# Patient Record
Sex: Female | Born: 1960 | Race: Asian | Hispanic: No | Marital: Married | Smoking: Never smoker
Health system: Southern US, Community
[De-identification: ages and names within clinical notes are randomized; demographics above are authoritative.]

## PROBLEM LIST (undated history)

## (undated) DIAGNOSIS — E871 Hypo-osmolality and hyponatremia: Secondary | ICD-10-CM

## (undated) DIAGNOSIS — N179 Acute kidney failure, unspecified: Secondary | ICD-10-CM

## (undated) DIAGNOSIS — R739 Hyperglycemia, unspecified: Secondary | ICD-10-CM

## (undated) DIAGNOSIS — N83209 Unspecified ovarian cyst, unspecified side: Secondary | ICD-10-CM

## (undated) HISTORY — PX: ABDOMINAL HYSTERECTOMY: SHX81

---

## 2003-03-02 ENCOUNTER — Encounter: Payer: Self-pay | Admitting: Hematology and Oncology

## 2003-03-02 ENCOUNTER — Encounter: Admission: RE | Admit: 2003-03-02 | Discharge: 2003-03-02 | Payer: Self-pay | Admitting: Hematology and Oncology

## 2003-10-22 ENCOUNTER — Other Ambulatory Visit: Admission: RE | Admit: 2003-10-22 | Discharge: 2003-10-22 | Payer: Self-pay | Admitting: Obstetrics & Gynecology

## 2005-06-02 ENCOUNTER — Other Ambulatory Visit: Admission: RE | Admit: 2005-06-02 | Discharge: 2005-06-02 | Payer: Self-pay | Admitting: Obstetrics and Gynecology

## 2006-06-10 ENCOUNTER — Encounter: Admission: RE | Admit: 2006-06-10 | Discharge: 2006-06-10 | Payer: Self-pay | Admitting: Obstetrics and Gynecology

## 2007-06-23 ENCOUNTER — Encounter: Admission: RE | Admit: 2007-06-23 | Discharge: 2007-06-23 | Payer: Self-pay | Admitting: Obstetrics and Gynecology

## 2008-07-06 ENCOUNTER — Ambulatory Visit (HOSPITAL_COMMUNITY): Admission: RE | Admit: 2008-07-06 | Discharge: 2008-07-06 | Payer: Self-pay | Admitting: Obstetrics and Gynecology

## 2008-07-31 ENCOUNTER — Ambulatory Visit (HOSPITAL_COMMUNITY): Admission: RE | Admit: 2008-07-31 | Discharge: 2008-08-01 | Payer: Self-pay | Admitting: Obstetrics and Gynecology

## 2008-07-31 ENCOUNTER — Encounter (INDEPENDENT_AMBULATORY_CARE_PROVIDER_SITE_OTHER): Payer: Self-pay | Admitting: Obstetrics and Gynecology

## 2008-09-01 ENCOUNTER — Inpatient Hospital Stay (HOSPITAL_COMMUNITY): Admission: AD | Admit: 2008-09-01 | Discharge: 2008-09-01 | Payer: Self-pay | Admitting: Obstetrics and Gynecology

## 2009-06-14 ENCOUNTER — Encounter: Admission: RE | Admit: 2009-06-14 | Discharge: 2009-06-14 | Payer: Self-pay | Admitting: Obstetrics and Gynecology

## 2010-08-19 ENCOUNTER — Encounter
Admission: RE | Admit: 2010-08-19 | Discharge: 2010-08-19 | Payer: Self-pay | Source: Home / Self Care | Attending: Obstetrics and Gynecology | Admitting: Obstetrics and Gynecology

## 2011-01-20 NOTE — H&P (Signed)
NAMEHarlo, [name]                    ACCOUNT NO.:  [id number]   MEDICAL RECORD NO.:  [id number]          PATIENT TYPE:  AMB   LOCATION:  SDC                           FACILITY:  [facility]   PHYSICIAN:  Guy Sandifer. Henderson Cloud, M.D. DATE OF BIRTH:  [birth year]   DATE OF ADMISSION:  DATE OF DISCHARGE:                              HISTORY & PHYSICAL   CHIEF COMPLAINT:  Uterine leiomyomata and ovarian cyst.   HISTORY OF PRESENT ILLNESS:  This patient is a 50 year old married  [place] female, G1, P1 with known uterine leiomyomata.  She has had  increasing cramping and sometimes bad central pelvic pain over the last  several weeks.  Ultrasound on July 05, 2008, she has a uterus  measuring 10.0 x 5.2 x 8.0 cm.  Multiple intramural leiomyomata are  noted ranging from 1.8-4.1 cm.  This represents some growth since May  2009.  The right ovary has a 1.6-cm simple cyst.  The left adnexa  contains a 7.4-cm mass.  Abdominal pelvic CT on July 06, 2008 is  consistent with a 5.6 cm lesion in the left ovary, most consistent with  a dermoid.  There is no evidence of organomegaly, ascites, or adenopathy  in the abdomen or the pelvis.  CA-125 is normal at 11.8.  Enlarging  leiomyomata and probable dermoid cyst of the left ovary is discussed  with the patient.  It should be noted that in the ultrasound in May of  this year, the left ovary appeared normal.  After discussing options of  management, she is being admitted for laparoscopically-assisted vaginal  hysterectomy with bilateral salpingo-oophorectomy.  Potential risks and  complications have been reviewed preoperatively.   PAST MEDICAL HISTORY:  Negative.   PAST SURGICAL HISTORY:  Negative.   FAMILY HISTORY:  Positive for breast cancer in mother, bladder cancer in  father, and chronic hypertension in father.   MEDICATIONS:  Birth control pill.   ALLERGIES:  No known drug allergies.   SOCIAL HISTORY:  Denies tobacco, alcohol, or drug abuse.   REVIEW  OF SYSTEMS:  NEURO:  Denies headache.  CARDIAC:  Denies chest  pain.  PULMONARY:  Denies shortness of breath.  GI:  Denies recent  changes in bowel habits.   PHYSICAL EXAMINATION:  GENERAL:  Height 5 feet 2 inches, weight 115  pounds, and blood pressure 130/70.  HEENT:  Without thyromegaly.  LUNGS:  Clear to auscultation.  HEART:  Regular rate and rhythm.  BACK:  No CVA tenderness.  ABDOMEN:  Soft and nontender without masses.  PELVIC:  Vulva, vagina and cervix without lesion.  Uterus is  retroverted, 8-10 weeks in size.  Adnexal exam compromised.  EXTREMITIES:  Grossly within normal limits.  NEUROLOGICAL:  Grossly within normal limits.   ASSESSMENT:  Enlarging uterine leiomyomata and left adnexal mass.   PLAN:  Laparoscopically-assisted vaginal hysterectomy with bilateral  salpingo-oophorectomy.      Guy Sandifer Henderson Cloud, M.D.  Electronically Signed     JET/MEDQ  D:  07/26/2008  T:  07/27/2008  Job:  [id number]

## 2011-01-20 NOTE — Op Note (Signed)
NAMECourteny, [name]                    ACCOUNT NO.:  [id number]   MEDICAL RECORD NO.:  [id number]          PATIENT TYPE:  OIB   LOCATION:  9316                          [facility]ILITY:  [facility]   PHYSICIAN:  Guy Sandifer. Henderson Cloud, M.D. DATE OF BIRTH:  Jul 15, 1961   DATE OF PROCEDURE:  07/31/2008  DATE OF DISCHARGE:                               OPERATIVE REPORT   PREOPERATIVE DIAGNOSES:  1. Uterine leiomyomata.  2. Left adnexal mass.   POSTOPERATIVE DIAGNOSES:  1. Leiomyomata.  2. Left ovarian cyst.   PROCEDURE:  Laparoscopically-assisted vaginal hysterectomy.   SURGEON:  Guy Sandifer. Henderson Cloud, MD   ASSISTANT:  Stann Mainland. Vincente Poli, MD   ANESTHESIA:  General endotracheal intubation.   SPECIMENS:  Uterus, bilateral tubes and ovaries, peritoneal washings all  to Pathology.   ESTIMATED BLOOD LOSS:  150 mL.   INDICATIONS AND CONSENT:  The patient is a 50 year old married [place]  female G1, P1, with known uterine leiomyomata.  She has increasing  cramping and pain.  She also has approximately 7 cm left adnexal mass.  Details are dictated in the history and physical.  Laparoscopically-  assisted vaginal hysterectomy with bilateral salpingo-oophorectomy has  been discussed with the patient and her husband preoperatively.  Potential risks and complications have been reviewed preoperatively  including, but not limited to infection, organ damage, bleeding  requiring transfusion of blood products with HIV and hepatitis  acquisition, DVT, PE, pneumonia, fistula formation, laparotomy, and  postoperative pelvic pain.  All questions have been answered and consent  was signed on the chart.   FINDINGS:  Upper abdomen is grossly normal.  The Uterus is about 6 weeks  in size with multiple intramural leiomyomata.  Right tube and ovary is  normal.  Left ovary contains approximately 7 cm smooth-walled cyst and  the ovaries totally free.  Peritoneal surfaces are smooth and  glistening.  Anterior posterior cul-de-sacs  were normal.   PROCEDURE IN DETAIL:  The patient was taken to operating room where she  was identified, placed in dorsal supine position, and general anesthesia  was induced via endotracheal intubation.  She was then placed in the  dorsal lithotomy position, where she was prepped abdominally and  vaginally.  Bladder straight catheterized.  Hulka tenaculum was placed  in the uterus as a manipulator, and she was draped in a sterile fashion.  The infraumbilical and suprapubic areas were injected in the midline  with approximately 5 mL of 0.5% plain Marcaine.  Small infraumbilical  incision was made and a disposable Veress needle was placed.  A normal  syringe and drop test were noted.  Two liters of gas were then  insufflated under low pressure with good tympany in the right upper  quadrant.  Veress needle was removed.  A 10/11 Xcel bladeless disposable  trocar sleeve was then placed using direct visualization with the  diagnostic laparoscope.  After placement, the operative laparoscope was  placed.  A small suprapubic incision was made at the midline and a 5-mm  Xcel bladeless disposable trocar sleeve was placed under direct  visualization without difficulty.  The above findings were noted.  Saline was washed through the pelvis and the abdomen.  It was collected  in the posterior cul-de-sac and sent to for cytology.  Course of the  ureters were identified bilaterally and seem to be well clear of the  area of surgery.  Then, using the EnSeal bipolar cautery device, the  right infundibulopelvic ligament was taken down following the  mesosalpinx across the round ligament down to the vesicouterine  peritoneum.  Similar procedure was carried out on the left side.  Vesicouterine peritoneum was taken down cephalad laterally.  Good  hemostasis was noted.  Instruments were removed.  Attention was turned  to the vagina.  Posterior cul-de-sac was entered sharply.  Cervix was  circumscribed with  unipolar cautery.  Mucosa was advanced sharply and  bluntly.  Then, using the LigaSure bipolar handheld device, the  uterosacral ligaments were taken down bilaterally followed by the  bladder pillars, cardinal ligaments, and uterine vessels.  Specimens  delivered posteriorly along with the ovaries, which were removed intact.  There is no spillage or rupture of the cyst.  Then, all suture will be 0  Monocryl unless otherwise designated.  Uterosacral ligaments were  plicated the vaginal cuff bilaterally.  Then, plicated in the midline  with a third suture.  Cuff was closed with figure-of-eight.  Foley  catheter was placed in the bladder and clear urine was noted.  The  laparoscope was then reintroduced with pneumoperitoneum and inspection  reveals minor bleeding at peritoneal edges, which was controlled with  bipolar cautery.  Inspection under reduced pneumoperitoneum reveals good  hemostasis.  Excess fluid was removed.  Plain Marcaine 10 mL of 0.5% was  instilled into the peritoneal cavity.  Trocar sleeves and instruments  were removed.  The skin incisions were closed with interrupted 3-0  Vicryl suture.  Dermabond was placed on both incisions as well.  All  counts were correct.  The patient was awakened and taken to recovery  room in stable condition.      Guy Sandifer Henderson Cloud, M.D.  Electronically Signed     JET/MEDQ  D:  07/31/2008  T:  07/31/2008  Job:  [id number]

## 2011-01-20 NOTE — Discharge Summary (Signed)
NAMEJayleah, [name]                    ACCOUNT NO.:  [id number]   MEDICAL RECORD NO.:  [id number]          PATIENT TYPE:  OIB   LOCATION:  9316                          [facility]ILITY[facility]   PHYSICIAN:  Guy Sandifer. Henderson Cloud, M.D. DATE OF BIRTH:  October 29, 1960   DATE OF ADMISSION:  07/31/2008  DATE OF DISCHARGE:  08/01/2008                               DISCHARGE SUMMARY   PROCEDURE:  Procedure on July 31, 2008. Laparoscopically-assisted  vaginal hysterectomy with bilateral salpingo-oophorectomy.   REASON FOR ADMISSION:  This patient is a 50 year old married female G1,  P1 with symptomatic uterine leiomyomata and an ovarian cyst.  Details  are dictated in history and physical.  She is admitted for surgical  management.   HOSPITAL COURSE:  The patient is admitted to the hospital and undergoes  the above procedure.  Postoperatively she has good pain relief and was  passing flatus.  Vital signs are stable.  She is afebrile with clear  urine output.  On the first postoperative day, she is passing flatus and  tolerating regular diet, ambulating, and voiding.  Vital signs are  stable.  She is afebrile.  Hemoglobin is 11.9 and pathology is pending.  Abdomen is flat and soft with good bowel sounds.   CONDITION ON DISCHARGE:  Good.   DIET:  Regular as tolerated.   ACTIVITY:  No lifting.  No operation of automobiles.  No vaginal entry.  She is to call the office for any problems including, but not limited  to, temperature 101 degrees, persistent nausea and vomiting, heavy  bleeding or increasing pain.   FOLLOWUP:  Follow up with [place] in the office in 2 weeks.   MEDICATIONS:  1. Percocet 5/325 mg # 40 1-2 p.o. q.6 h. p.r.n.  2. Ibuprofen q.6 h p.r.n.  3. Colace daily p.r.n.  4. Phenergan 25 mg # 10 one p.o. q.8 h. p.r.n. nausea and vomiting no      refills.      Guy Sandifer Henderson Cloud, M.D.  Electronically Signed     JET/MEDQ  D:  08/01/2008  T:  08/01/2008  Job:  [id number]

## 2011-06-09 LAB — COMPREHENSIVE METABOLIC PANEL
ALT: 17
Alkaline Phosphatase: 42
CO2: 26
Chloride: 103
GFR calc non Af Amer: >60
Glucose, Bld: 115 — ABNORMAL HIGH
Potassium: 4.3
Sodium: 136

## 2011-06-09 LAB — CBC
Hemoglobin: 11.9 — ABNORMAL LOW
Hemoglobin: 15.4 — ABNORMAL HIGH
MCHC: 34.7
MCV: 97.7
RBC: 3.52 — ABNORMAL LOW
RBC: 4.56
WBC: 11.3 — ABNORMAL HIGH

## 2011-06-09 LAB — HCG, SERUM, QUALITATIVE: Preg, Serum: NEGATIVE

## 2011-06-12 LAB — CBC
HCT: 36.7 % (ref 36.0–46.0)
MCHC: 33.8 g/dL (ref 30.0–36.0)
MCV: 97.3 fL (ref 78.0–100.0)
Platelets: 290 10*3/uL (ref 150–400)
WBC: 8.4 10*3/uL (ref 4.0–10.5)

## 2011-10-30 ENCOUNTER — Other Ambulatory Visit: Payer: Self-pay | Admitting: Obstetrics and Gynecology

## 2011-10-30 DIAGNOSIS — Z1231 Encounter for screening mammogram for malignant neoplasm of breast: Secondary | ICD-10-CM

## 2011-11-12 ENCOUNTER — Ambulatory Visit
Admission: RE | Admit: 2011-11-12 | Discharge: 2011-11-12 | Disposition: A | Discharge status: Home / Self Care | Payer: PRIVATE HEALTH INSURANCE | Source: Ambulatory Visit | Attending: Obstetrics and Gynecology | Admitting: Obstetrics and Gynecology

## 2011-11-12 DIAGNOSIS — Z1231 Encounter for screening mammogram for malignant neoplasm of breast: Secondary | ICD-10-CM

## 2012-07-26 ENCOUNTER — Other Ambulatory Visit: Payer: Self-pay | Admitting: Family Medicine

## 2012-07-26 DIAGNOSIS — M8580 Other specified disorders of bone density and structure, unspecified site: Secondary | ICD-10-CM

## 2012-07-28 ENCOUNTER — Other Ambulatory Visit: Payer: Self-pay | Admitting: Family Medicine

## 2012-07-28 DIAGNOSIS — Z1231 Encounter for screening mammogram for malignant neoplasm of breast: Secondary | ICD-10-CM

## 2012-09-02 ENCOUNTER — Other Ambulatory Visit: Payer: PRIVATE HEALTH INSURANCE

## 2012-09-02 ENCOUNTER — Ambulatory Visit: Payer: PRIVATE HEALTH INSURANCE

## 2012-11-17 ENCOUNTER — Ambulatory Visit
Admission: RE | Admit: 2012-11-17 | Discharge: 2012-11-17 | Disposition: A | Discharge status: Home / Self Care | Payer: PRIVATE HEALTH INSURANCE | Source: Ambulatory Visit | Attending: Family Medicine | Admitting: Family Medicine

## 2012-11-17 DIAGNOSIS — M8580 Other specified disorders of bone density and structure, unspecified site: Secondary | ICD-10-CM

## 2012-11-17 DIAGNOSIS — Z1231 Encounter for screening mammogram for malignant neoplasm of breast: Secondary | ICD-10-CM

## 2013-05-22 ENCOUNTER — Other Ambulatory Visit: Payer: Self-pay | Admitting: Gastroenterology

## 2013-10-13 ENCOUNTER — Other Ambulatory Visit: Payer: Self-pay

## 2013-10-13 DIAGNOSIS — Z1231 Encounter for screening mammogram for malignant neoplasm of breast: Secondary | ICD-10-CM

## 2013-11-23 ENCOUNTER — Ambulatory Visit: Payer: PRIVATE HEALTH INSURANCE

## 2013-12-19 ENCOUNTER — Ambulatory Visit: Payer: PRIVATE HEALTH INSURANCE

## 2014-01-18 ENCOUNTER — Ambulatory Visit: Payer: PRIVATE HEALTH INSURANCE

## 2014-01-25 ENCOUNTER — Encounter (INDEPENDENT_AMBULATORY_CARE_PROVIDER_SITE_OTHER): Payer: Self-pay

## 2014-01-25 ENCOUNTER — Ambulatory Visit
Admission: RE | Admit: 2014-01-25 | Discharge: 2014-01-25 | Disposition: A | Discharge status: Home / Self Care | Payer: PRIVATE HEALTH INSURANCE | Source: Ambulatory Visit

## 2014-01-25 DIAGNOSIS — Z1231 Encounter for screening mammogram for malignant neoplasm of breast: Secondary | ICD-10-CM

## 2015-03-08 ENCOUNTER — Other Ambulatory Visit: Payer: Self-pay

## 2015-03-08 DIAGNOSIS — Z1231 Encounter for screening mammogram for malignant neoplasm of breast: Secondary | ICD-10-CM

## 2015-04-08 ENCOUNTER — Ambulatory Visit
Admission: RE | Admit: 2015-04-08 | Discharge: 2015-04-08 | Disposition: A | Discharge status: Home / Self Care | Payer: Commercial Managed Care - PPO | Source: Ambulatory Visit

## 2015-04-08 DIAGNOSIS — Z1231 Encounter for screening mammogram for malignant neoplasm of breast: Secondary | ICD-10-CM

## 2015-06-06 ENCOUNTER — Encounter (INDEPENDENT_AMBULATORY_CARE_PROVIDER_SITE_OTHER): Payer: Commercial Managed Care - PPO | Admitting: Ophthalmology

## 2015-06-06 DIAGNOSIS — H43813 Vitreous degeneration, bilateral: Secondary | ICD-10-CM

## 2015-06-06 DIAGNOSIS — H4423 Degenerative myopia, bilateral: Secondary | ICD-10-CM | POA: Diagnosis not present

## 2015-12-04 ENCOUNTER — Encounter (INDEPENDENT_AMBULATORY_CARE_PROVIDER_SITE_OTHER): Payer: Commercial Managed Care - PPO | Admitting: Ophthalmology

## 2016-01-01 ENCOUNTER — Encounter (INDEPENDENT_AMBULATORY_CARE_PROVIDER_SITE_OTHER): Payer: Commercial Managed Care - PPO | Admitting: Ophthalmology

## 2016-01-01 DIAGNOSIS — H2513 Age-related nuclear cataract, bilateral: Secondary | ICD-10-CM | POA: Diagnosis not present

## 2016-01-01 DIAGNOSIS — H4423 Degenerative myopia, bilateral: Secondary | ICD-10-CM

## 2016-01-01 DIAGNOSIS — H43813 Vitreous degeneration, bilateral: Secondary | ICD-10-CM | POA: Diagnosis not present

## 2016-02-25 ENCOUNTER — Encounter (HOSPITAL_BASED_OUTPATIENT_CLINIC_OR_DEPARTMENT_OTHER): Payer: Self-pay | Admitting: *Deleted

## 2016-02-25 ENCOUNTER — Observation Stay (HOSPITAL_BASED_OUTPATIENT_CLINIC_OR_DEPARTMENT_OTHER)
Admission: EM | Admit: 2016-02-25 | Discharge: 2016-02-27 | Disposition: A | Discharge status: Home / Self Care | Payer: Commercial Managed Care - PPO | Attending: Internal Medicine | Admitting: Internal Medicine

## 2016-02-25 DIAGNOSIS — E878 Other disorders of electrolyte and fluid balance, not elsewhere classified: Secondary | ICD-10-CM | POA: Diagnosis not present

## 2016-02-25 DIAGNOSIS — R251 Tremor, unspecified: Secondary | ICD-10-CM | POA: Insufficient documentation

## 2016-02-25 DIAGNOSIS — N179 Acute kidney failure, unspecified: Secondary | ICD-10-CM | POA: Diagnosis not present

## 2016-02-25 DIAGNOSIS — R42 Dizziness and giddiness: Secondary | ICD-10-CM | POA: Diagnosis not present

## 2016-02-25 DIAGNOSIS — R739 Hyperglycemia, unspecified: Secondary | ICD-10-CM | POA: Diagnosis not present

## 2016-02-25 DIAGNOSIS — R262 Difficulty in walking, not elsewhere classified: Secondary | ICD-10-CM | POA: Insufficient documentation

## 2016-02-25 DIAGNOSIS — E871 Hypo-osmolality and hyponatremia: Principal | ICD-10-CM | POA: Diagnosis present

## 2016-02-25 HISTORY — DX: Unspecified ovarian cyst, unspecified side: N83.209

## 2016-02-25 HISTORY — DX: Acute kidney failure, unspecified: N17.9

## 2016-02-25 HISTORY — DX: Hypo-osmolality and hyponatremia: E87.1

## 2016-02-25 HISTORY — DX: Hyperglycemia, unspecified: R73.9

## 2016-02-25 LAB — BASIC METABOLIC PANEL
ANION GAP: 7 (ref 5–15)
ANION GAP: 9 (ref 5–15)
ANION GAP: 7 (ref 5–15)
BUN: 6 mg/dL (ref 6–20)
BUN: <5 mg/dL — ABNORMAL LOW (ref 6–20)
BUN: <5 mg/dL — ABNORMAL LOW (ref 6–20)
CALCIUM: 8 mg/dL — AB (ref 8.9–10.3)
CHLORIDE: 86 mmol/L — AB (ref 101–111)
CHLORIDE: 87 mmol/L — AB (ref 101–111)
CHLORIDE: 88 mmol/L — AB (ref 101–111)
CO2: 20 mmol/L — AB (ref 22–32)
CO2: 19 mmol/L — AB (ref 22–32)
CO2: 20 mmol/L — AB (ref 22–32)
Calcium: 7.5 mg/dL — ABNORMAL LOW (ref 8.9–10.3)
Calcium: 7.8 mg/dL — ABNORMAL LOW (ref 8.9–10.3)
Creatinine, Ser: 0.41 mg/dL — ABNORMAL LOW (ref 0.44–1.00)
Creatinine, Ser: 0.48 mg/dL (ref 0.44–1.00)
Creatinine, Ser: 0.4 mg/dL — ABNORMAL LOW (ref 0.44–1.00)
GFR calc Af Amer: >60 mL/min (ref >60)
GFR calc non Af Amer: >60 mL/min (ref >60)
GFR calc non Af Amer: >60 mL/min (ref >60)
GLUCOSE: 127 mg/dL — AB (ref 65–99)
GLUCOSE: 116 mg/dL — AB (ref 65–99)
GLUCOSE: 118 mg/dL — AB (ref 65–99)
POTASSIUM: 3.8 mmol/L (ref 3.5–5.1)
Potassium: 3.7 mmol/L (ref 3.5–5.1)
Potassium: 4 mmol/L (ref 3.5–5.1)
Sodium: 113 mmol/L — CL (ref 135–145)
Sodium: 115 mmol/L — CL (ref 135–145)
Sodium: 115 mmol/L — CL (ref 135–145)

## 2016-02-25 LAB — URINE MICROSCOPIC-ADD ON

## 2016-02-25 LAB — COMPREHENSIVE METABOLIC PANEL
ALK PHOS: 60 U/L (ref 38–126)
ALT: 18 U/L (ref 14–54)
AST: 31 U/L (ref 15–41)
Albumin: 3.9 g/dL (ref 3.5–5.0)
Anion gap: 10 (ref 5–15)
BUN: 7 mg/dL (ref 6–20)
CALCIUM: 8.4 mg/dL — AB (ref 8.9–10.3)
CO2: 20 mmol/L — ABNORMAL LOW (ref 22–32)
CREATININE: 0.5 mg/dL (ref 0.44–1.00)
Chloride: 82 mmol/L — ABNORMAL LOW (ref 101–111)
Glucose, Bld: 145 mg/dL — ABNORMAL HIGH (ref 65–99)
Potassium: 3.7 mmol/L (ref 3.5–5.1)
Sodium: 112 mmol/L — CL (ref 135–145)
TOTAL PROTEIN: 7 g/dL (ref 6.5–8.1)
Total Bilirubin: 1.2 mg/dL (ref 0.3–1.2)

## 2016-02-25 LAB — CBC WITH DIFFERENTIAL/PLATELET
BASOS PCT: 0 %
Basophils Absolute: 0 10*3/uL (ref 0.0–0.1)
EOS ABS: 0 10*3/uL (ref 0.0–0.7)
EOS PCT: 0 %
HCT: 35 % — ABNORMAL LOW (ref 36.0–46.0)
HEMOGLOBIN: 12.7 g/dL (ref 12.0–15.0)
Lymphocytes Relative: 10 %
Lymphs Abs: 0.8 10*3/uL (ref 0.7–4.0)
MCH: 31.7 pg (ref 26.0–34.0)
MCHC: 36.3 g/dL — AB (ref 30.0–36.0)
MCV: 87.3 fL (ref 78.0–100.0)
Monocytes Absolute: 0.4 10*3/uL (ref 0.1–1.0)
Monocytes Relative: 4 %
NEUTROS PCT: 85 %
Neutro Abs: 6.9 10*3/uL (ref 1.7–7.7)
PLATELETS: 255 10*3/uL (ref 150–400)
RBC: 4.01 MIL/uL (ref 3.87–5.11)
RDW: 10.9 % — ABNORMAL LOW (ref 11.5–15.5)
WBC: 8.1 10*3/uL (ref 4.0–10.5)

## 2016-02-25 LAB — URINALYSIS, ROUTINE W REFLEX MICROSCOPIC
Bilirubin Urine: NEGATIVE
Glucose, UA: >1000 mg/dL — AB
KETONES UR: 40 mg/dL — AB
LEUKOCYTES UA: NEGATIVE
NITRITE: NEGATIVE
PH: 7 (ref 5.0–8.0)
PROTEIN: 30 mg/dL — AB
Specific Gravity, Urine: 1.022 (ref 1.005–1.030)

## 2016-02-25 LAB — GLUCOSE, CAPILLARY: Glucose-Capillary: 136 mg/dL — ABNORMAL HIGH (ref 65–99)

## 2016-02-25 LAB — OSMOLALITY: OSMOLALITY: 239 mosm/kg — AB (ref 275–295)

## 2016-02-25 LAB — SODIUM, URINE, RANDOM: Sodium, Ur: 98 mmol/L

## 2016-02-25 LAB — OSMOLALITY, URINE: Osmolality, Ur: 498 mosm/kg (ref 300–900)

## 2016-02-25 LAB — PREGNANCY, URINE: Preg Test, Ur: NEGATIVE

## 2016-02-25 LAB — MAGNESIUM: MAGNESIUM: 1.5 mg/dL — AB (ref 1.7–2.4)

## 2016-02-25 MED ORDER — ONDANSETRON HCL 4 MG/2ML IJ SOLN: 4.0000 mg | Freq: Four times a day (QID) | INTRAMUSCULAR | Status: DC | PRN

## 2016-02-25 MED ORDER — SODIUM CHLORIDE 0.9 % IV BOLUS (SEPSIS)
1000.0000 mL | Freq: Once | INTRAVENOUS | Status: AC
  Administered 2016-02-25: 1000 mL via INTRAVENOUS

## 2016-02-25 MED ORDER — SODIUM CHLORIDE 0.9 % IV SOLN
INTRAVENOUS | Status: DC
  Administered 2016-02-25: 75 mL via INTRAVENOUS

## 2016-02-25 MED ORDER — LORAZEPAM 2 MG/ML IJ SOLN: 1.0000 mg | Freq: Once | INTRAMUSCULAR | Status: DC | PRN

## 2016-02-25 MED ORDER — ONDANSETRON HCL 4 MG PO TABS: 4.0000 mg | ORAL_TABLET | Freq: Four times a day (QID) | ORAL | Status: DC | PRN

## 2016-02-25 MED ORDER — LORAZEPAM 2 MG/ML IJ SOLN
0.5000 mg | Freq: Once | INTRAMUSCULAR | Status: AC
  Administered 2016-02-25: 0.5 mg via INTRAVENOUS
  Filled 2016-02-25: qty 1

## 2016-02-25 MED ORDER — TRAZODONE HCL 50 MG PO TABS
50.0000 mg | ORAL_TABLET | Freq: Every evening | ORAL | Status: DC | PRN
  Filled 2016-02-25 (×2): qty 1

## 2016-02-25 MED ORDER — HYDROCODONE-ACETAMINOPHEN 5-325 MG PO TABS: 1.0000 | ORAL_TABLET | ORAL | Status: DC | PRN

## 2016-02-25 MED ORDER — POLYETHYLENE GLYCOL 3350 17 G PO PACK: 17.0000 g | PACK | Freq: Every day | ORAL | Status: DC | PRN

## 2016-02-25 MED ORDER — ACETAMINOPHEN 650 MG RE SUPP: 650.0000 mg | Freq: Four times a day (QID) | RECTAL | Status: DC | PRN

## 2016-02-25 MED ORDER — MAGNESIUM SULFATE 2 GM/50ML IV SOLN
2.0000 g | Freq: Once | INTRAVENOUS | Status: AC
  Administered 2016-02-25: 2 g via INTRAVENOUS
  Filled 2016-02-25: qty 50

## 2016-02-25 MED ORDER — ENOXAPARIN SODIUM 40 MG/0.4ML ~~LOC~~ SOLN
40.0000 mg | SUBCUTANEOUS | Status: DC
  Administered 2016-02-25 – 2016-02-26 (×2): 40 mg via SUBCUTANEOUS
  Filled 2016-02-25 (×2): qty 0.4

## 2016-02-25 MED ORDER — ACETAMINOPHEN 325 MG PO TABS: 650.0000 mg | ORAL_TABLET | Freq: Four times a day (QID) | ORAL | Status: DC | PRN

## 2016-02-25 NOTE — Progress Notes (Signed)
Patient coming from [facility] due to profound symptomatic hyponatremia. Sodium 112. Patient to be continued on normal saline. Patient will be placed in telemetry. Previous known sodium was 136 back in 2009. This is felt to be due to a medication reaction as patient was recently started on Bactrim for UTI. Patient's symptoms include resting tremor and vertigo. Otherwise workup benign.   Shelly Flattenavid Merrell, MD [facility] 02/25/2016, 1:22 PM

## 2016-02-25 NOTE — ED Notes (Signed)
Notified [facility] of request for transportation to [facility] [phone] awaiting truck

## 2016-02-25 NOTE — Progress Notes (Signed)
CRITICAL VALUE ALERT  Critical value received: NA 115  Date of notification:02-25-16   Time of notification:  1745  Critical value read back:Yes.    Nurse who received alert:  Sophronia SimasMiranda Custer Pimenta RN   MD notified (1st page):  Dr Konrad DoloresMerrell   Time of first page:  1750  MD notified (2nd page):  Time of second page:  Responding MD:  Dr Konrad DoloresMerrell  Time MD responded:  1800

## 2016-02-25 NOTE — ED Notes (Signed)
Pt here with her husband.  He feels that she is having an allergic reaction to Septra DS.  Pt is having difficulty ambulating, has a tremor and reports dizziness.  Pt was put on Macrobid for UTI and when urine culture returned she was switched to Septra DS on 6/16.  She began feeling dizziness and tremor and difficulty ambulating on Sunday.  Last dose of Septra was taken yesterday am.  Husband states that she is alert and oriented to slower to respond ("few second delay").  No focal neuro deficit, symptoms are generalized.  Dizziness when laying down with eyes closed, improves when looking around and sitting up (couldnt sleep due to dizziness)

## 2016-02-25 NOTE — ED Notes (Signed)
Hospitalist has been repaged

## 2016-02-25 NOTE — H&P (Signed)
History and Physical    [name] ZOX:[id number] DOB: 08/18/1961 DOA: 02/25/2016  PCP: No primary care provider on file. Patient coming from: [facility]  Chief Complaint: Dizziness and shaking  HPI: [name] is a 55 y.o. female with medical history significant of hysterectomy secondary to ovarian cysts. Patient presenting with one half day complaint of tremor, dizziness and nausea. Patient states that approximately 5 days ago she started on Macrobid for treatment of UTI. She was called 3 days ago and told that she needed a different antibiotic in order to properly treat her UTI. She was subsequently started on Bactrim. Sunday night patient began developing slight tremor and dizziness. This is become constant and progressive. Patient states that this is sometimes like the room is spinning. Denies any vomiting, chest pain, short of breath, palpitations, headache, neck stiffness, fevers, diarrhea. Patient states that her dysuria and frequency have completely resolved. Denies any focal weakness in upper or lower extremities or truncal instability. No previous symptoms like this.    ED Course: Basic labs drawn the patient found be profoundly hyponatremic at 112. Patient also found to be hyperglycemic. No imaging studies done. Patient given 1 L normal saline bolus and [facility]  Review of Systems: As per HPI otherwise 10 point review of systems negative.   Ambulatory Status:No restrictions  Past Medical History  Diagnosis Date  . Hyponatremia   . Hyperglycemia   . Acute kidney injury (HCC)   . Ovarian cyst     Past Surgical History  Procedure Laterality Date  . Abdominal hysterectomy      Social History   Social History  . Marital Status: Married    Spouse Name: N/A  . Number of Children: N/A  . Years of Education: N/A   Occupational History  . Not on file.   Social History Main Topics  . Smoking status: Never Smoker   . Smokeless tobacco: Not  on file  . Alcohol Use: No  . Drug Use: Not on file  . Sexual Activity: Not on file   Other Topics Concern  . Not on file   Social History Narrative  . No narrative on file    Allergies  Allergen Reactions  . Septra [Sulfamethoxazole-Trimethoprim]     Dizziness, generalized weakness and tremor    Family History  Problem Relation Age of Onset  . Hypertension    . Diabetes    . Cancer      Prior to Admission medications   Medication Sig Start Date End Date Taking? Authorizing Provider  ondansetron (ZOFRAN) 4 MG tablet Take 4 mg by mouth every 8 (eight) hours as needed for nausea or vomiting.   Yes Historical Provider, MD    Physical Exam: Filed Vitals:   02/25/16 1334 02/25/16 1350 02/25/16 1400 02/25/16 1642  BP:  109/69 122/58 180/62  Pulse:  85 87 88  Temp:    98 F (36.7 C)  TempSrc:    Oral  Resp: 17 21 17 18   Height:      Weight:    52.617 kg (116 lb)  SpO2:  97% 99% 98%     General:  Appears calm and comfortable Eyes:  PERRL, EOMI, normal lids, iris ENT:  grossly normal hearing, lips & tongue, mmm Neck:  no LAD, masses or thyromegaly Cardiovascular:  RRR, no m/r/g. No LE edema.  Respiratory:  CTA bilaterally, no w/r/r. Normal respiratory effort. Abdomen:  soft, ntnd, NABS Skin:  no rash  or induration seen on limited exam Musculoskeletal:  grossly normal tone BUE/BLE, good ROM, no bony abnormality Psychiatric:  grossly normal mood and affect, speech fluent and appropriate, AOx3 Neurologic: Cranial nerves II through XII intact, moves all extremity coronary fashion, strength 5 out of 5 bilateral, lower extremity flexion 5 out of 5 bilateral, resting tremor, no asterixis. Patient endorses dizziness which is worsened with head movement.  Labs on Admission: I have personally reviewed following labs and imaging studies  CBC:  Recent Labs Lab 02/25/16 0959  WBC 8.1  NEUTROABS 6.9  HGB 12.7  HCT 35.0*  MCV 87.3  PLT 255   Basic Metabolic  Panel:  Recent Labs Lab 02/25/16 0959 02/25/16 1330  NA 112* 113*  K 3.7 3.8  CL 82* 86*  CO2 20* 20*  GLUCOSE 145* 127*  BUN 7 6  CREATININE 0.50 0.41*  CALCIUM 8.4* 7.5*  MG 1.5*  --    GFR: Estimated Creatinine Clearance: 63.6 mL/min (by C-G formula based on Cr of 0.41). Liver Function Tests:  Recent Labs Lab 02/25/16 0959  AST 31  ALT 18  ALKPHOS 60  BILITOT 1.2  PROT 7.0  ALBUMIN 3.9   No results for input(s): LIPASE, AMYLASE in the last 168 hours. No results for input(s): AMMONIA in the last 168 hours. Coagulation Profile: No results for input(s): INR, PROTIME in the last 168 hours. Cardiac Enzymes: No results for input(s): CKTOTAL, CKMB, CKMBINDEX, TROPONINI in the last 168 hours. BNP (last 3 results) No results for input(s): PROBNP in the last 8760 hours. HbA1C: No results for input(s): HGBA1C in the last 72 hours. CBG: No results for input(s): GLUCAP in the last 168 hours. Lipid Profile: No results for input(s): CHOL, HDL, LDLCALC, TRIG, CHOLHDL, LDLDIRECT in the last 72 hours. Thyroid Function Tests: No results for input(s): TSH, T4TOTAL, FREET4, T3FREE, THYROIDAB in the last 72 hours. Anemia Panel: No results for input(s): VITAMINB12, FOLATE, FERRITIN, TIBC, IRON, RETICCTPCT in the last 72 hours. Urine analysis:    Component Value Date/Time   COLORURINE YELLOW 02/25/2016 1100   APPEARANCEUR CLEAR 02/25/2016 1100   LABSPEC 1.022 02/25/2016 1100   PHURINE 7.0 02/25/2016 1100   GLUCOSEU >1000* 02/25/2016 1100   HGBUR MODERATE* 02/25/2016 1100   BILIRUBINUR NEGATIVE 02/25/2016 1100   KETONESUR 40* 02/25/2016 1100   PROTEINUR 30* 02/25/2016 1100   NITRITE NEGATIVE 02/25/2016 1100   LEUKOCYTESUR NEGATIVE 02/25/2016 1100    Creatinine Clearance: Estimated Creatinine Clearance: 63.6 mL/min (by C-G formula based on Cr of 0.41).  Sepsis Labs: (procalcitonin:4,lacticidven:4) )No results found for this or any previous visit (from the past  240 hour(s)).   Radiological Exams on Admission: No results found.    Assessment/Plan Principal Problem:   Hyponatremia Active Problems:   Acute kidney injury (HCC)   Hyperglycemia   Hypomagnesemia   Hyponatremia: Na 112 on presentation to [facility]. This is never been a problem for the patient before. Suspect this is likely due to a reaction to treatment with Bactrim recently. No other inciting etiology. This will be treated as a chronic change and correction will be at a goal of 9 mmol per liter per day. - NS 125/hr - BMET Q6 - Stop Bactrim - f/u Osmolality studies  Hyperglycemia: 147 serum w/ >1000 in urine. No h/o DM.  - A1c - CBG Q4  Dizziness/Ha/tremor: likely due to multiple metabolic derangements includine profound hyponatremia, hypochloremia, hyperglycemia, hypo-magnesia. Mag given at [facility] - treatment as above. - BMET in am - As sx  are constant, regardless movement will obtain MRI to r/o intracranial process  UTI: UA q/o evidence of UTI. Recently completed 3 days of bactrim - UCX - DC Bactrim  DVT prophylaxis: Lovenox  Code Status: FULL  Family Communication: Husband  Disposition Plan: Pending improvement  Consults called: None  Admission status: Inpatient    Yariel Ferraris J MD [facility]  If 7PM-7AM, please contact night-coverage [email or web address] Password [email or web address]  02/25/2016, 5:15 PM

## 2016-02-25 NOTE — ED Provider Notes (Signed)
CSN: [id number]     Arrival date & time 02/25/16  0911 History   First MD Initiated Contact with Patient 02/25/16 0914     Chief Complaint  Patient presents with  . Medication Reaction     (Consider location/radiation/quality/duration/timing/severity/associated sxs/prior Treatment) HPI 55 year old female who presents with dizziness and concern for medication reaction. She has no significant past medical history. States that recently she developed a urinary tract infection, and her antibiotics was changed from Macrobid to Bactrim 3 days ago. She started taking her Bactrim Saturday, and noticed that Sunday evening she was developing dizziness. States that she feels that she is sea sick, like the room is spinning and moving in front of her. She has nausea but no vomiting. Associated with gait instability. No double vision, difficulty swallowing, slurred speech, focal numbness or weakness, word finding difficulty. States that she has a 3 second delay in answer questions at times.   History reviewed. No pertinent past medical history. Past Surgical History  Procedure Laterality Date  . Abdominal hysterectomy     History reviewed. No pertinent family history. Social History  Substance Use Topics  . Smoking status: Never Smoker   . Smokeless tobacco: None  . Alcohol Use: No   OB History    No data available     Review of Systems 10/14 systems reviewed and are negative other than those stated in the HPI    Allergies  Septra  Home Medications   Prior to Admission medications   Medication Sig Start Date End Date Taking? Authorizing Provider  ondansetron (ZOFRAN) 4 MG tablet Take 4 mg by mouth every 8 (eight) hours as needed for nausea or vomiting.   Yes Historical Provider, MD   BP 106/93 mmHg  Pulse 77  Temp(Src) 99.4 F (37.4 C) (Oral)  Resp 19  Ht  (1.575 m)  Wt 115 lb (52.164 kg)  BMI 21.03 kg/m2  SpO2 98% Physical Exam Physical Exam  Nursing note and vitals  reviewed. Constitutional: Well developed, well nourished, non-toxic, and in no acute distress Head: Normocephalic and atraumatic.  Mouth/Throat: Oropharynx is clear and moist.  Neck: Normal range of motion. Neck supple.  Cardiovascular: Normal rate and regular rhythm.   Pulmonary/Chest: Effort normal and breath sounds normal.  Abdominal: Soft. There is no tenderness. There is no rebound and no guarding.  Musculoskeletal: Normal range of motion.  Neurological: Alert, no facial droop, fluent speech, moves all extremities symmetrically, no pronator drift or LE drift against gravity. Sensation to light touch in tact in all 4 extremities, PERRL, EOMI, resting and intention tremor bilaterally in upper and lower extremities. Skin: Skin is warm and dry.  Psychiatric: Cooperative  ED Course  Procedures (including critical care time) Labs Review Labs Reviewed  URINALYSIS, ROUTINE W REFLEX MICROSCOPIC (NOT AT [facility]) - Abnormal; Notable for the following:    Glucose, UA >1000 (*)    Hgb urine dipstick MODERATE (*)    Ketones, ur 40 (*)    Protein, ur 30 (*)    All other components within normal limits  CBC WITH DIFFERENTIAL/PLATELET - Abnormal; Notable for the following:    HCT 35.0 (*)    MCHC 36.3 (*)    RDW 10.9 (*)    All other components within normal limits  COMPREHENSIVE METABOLIC PANEL - Abnormal; Notable for the following:    Sodium 112 (*)    Chloride 82 (*)    CO2 20 (*)    Glucose, Bld 145 (*)  Calcium 8.4 (*)    All other components within normal limits  MAGNESIUM - Abnormal; Notable for the following:    Magnesium 1.5 (*)    All other components within normal limits  URINE MICROSCOPIC-ADD ON - Abnormal; Notable for the following:    Squamous Epithelial / LPF 0-5 (*)    Bacteria, UA FEW (*)    All other components within normal limits  PREGNANCY, URINE    Imaging Review No results found. I have personally reviewed and evaluated these images and lab results as part of  my medical decision-making.   EKG Interpretation None     CRITICAL CARE Performed by: Lavera Guiseana Duo Jarvin Ogren   Total critical care time: 30 minutes  Critical care time was exclusive of separately billable procedures and treating other patients.  Critical care was necessary to treat or prevent imminent or life-threatening deterioration.  Critical care was time spent personally by me on the following activities: development of treatment plan with patient and/or surrogate as well as nursing, discussions with consultants, evaluation of patient's response to treatment, examination of patient, obtaining history from patient or surrogate, ordering and performing treatments and interventions, ordering and review of laboratory studies, ordering and review of radiographic studies, pulse oximetry and re-evaluation of patient's condition.  MDM   Final diagnoses:  Hyponatremia    55 year old female who presents with dizziness and tremoring after taking Bactrim 3 days ago. On presentation is nontoxic and in no acute distress, but with active tremoring in bilateral arms and legs. Otherwise is grossly neurologically intact. Symptoms sound vertiginous in nature. Blood work with severe hyponatremia of 112. No history of seizures, and still mentating normally. Had received a liter of IV normal saline prior to blood work resulting. Had also received 0.5 mg of Ativan for tremors and vertigo, with subjective improvement.  It does appear that Bactrim can have side effects of vertigo, ataxia, and hyponatremia. She has discontinued this medication yesterday, last dose yesterday morning. Will admit for ongoing management.  PCP: Dr. Tenny Crawoss from [facility]  Diane Hanel Gearldine Bienenstockuo Breland Trouten, MD 02/25/16 1146

## 2016-02-25 NOTE — ED Notes (Signed)
Report given to RN on floor 6 e

## 2016-02-25 NOTE — Progress Notes (Signed)
CRITICAL VALUE ALERT  Critical value received:  Serum osmolality 239  Date of notification:  02-25-16   Time of notification:  1847  Critical value read back:Yes.    Nurse who received alert:  Sophronia SimasMiranda Aarya Robinson RN  MD notified (1st page):  Dr Konrad DoloresMerrell   Time of first page:  1851    MD notified (2nd page):  Time of second page:  Responding MD:    Time MD responded:

## 2016-02-26 ENCOUNTER — Inpatient Hospital Stay (HOSPITAL_COMMUNITY): Payer: Commercial Managed Care - PPO

## 2016-02-26 DIAGNOSIS — E871 Hypo-osmolality and hyponatremia: Secondary | ICD-10-CM

## 2016-02-26 DIAGNOSIS — R251 Tremor, unspecified: Secondary | ICD-10-CM | POA: Diagnosis not present

## 2016-02-26 DIAGNOSIS — R42 Dizziness and giddiness: Secondary | ICD-10-CM | POA: Diagnosis not present

## 2016-02-26 LAB — BASIC METABOLIC PANEL
ANION GAP: 4 — AB (ref 5–15)
Anion gap: 6 (ref 5–15)
Anion gap: 6 (ref 5–15)
Anion gap: 6 (ref 5–15)
Anion gap: 5 (ref 5–15)
Anion gap: 5 (ref 5–15)
BUN: 8 mg/dL (ref 6–20)
BUN: 9 mg/dL (ref 6–20)
BUN: 9 mg/dL (ref 6–20)
BUN: 9 mg/dL (ref 6–20)
BUN: 9 mg/dL (ref 6–20)
CALCIUM: 8.4 mg/dL — AB (ref 8.9–10.3)
CALCIUM: 8.1 mg/dL — AB (ref 8.9–10.3)
CALCIUM: 8.3 mg/dL — AB (ref 8.9–10.3)
CALCIUM: 7.8 mg/dL — AB (ref 8.9–10.3)
CHLORIDE: 91 mmol/L — AB (ref 101–111)
CHLORIDE: 103 mmol/L (ref 101–111)
CO2: 22 mmol/L (ref 22–32)
CO2: 22 mmol/L (ref 22–32)
CO2: 24 mmol/L (ref 22–32)
CO2: 23 mmol/L (ref 22–32)
CO2: 24 mmol/L (ref 22–32)
CO2: 24 mmol/L (ref 22–32)
CREATININE: 0.73 mg/dL (ref 0.44–1.00)
CREATININE: 0.63 mg/dL (ref 0.44–1.00)
CREATININE: 0.57 mg/dL (ref 0.44–1.00)
Calcium: 7.8 mg/dL — ABNORMAL LOW (ref 8.9–10.3)
Calcium: 8.3 mg/dL — ABNORMAL LOW (ref 8.9–10.3)
Chloride: 102 mmol/L (ref 101–111)
Chloride: 102 mmol/L (ref 101–111)
Chloride: 102 mmol/L (ref 101–111)
Chloride: 106 mmol/L (ref 101–111)
Creatinine, Ser: 0.5 mg/dL (ref 0.44–1.00)
Creatinine, Ser: 0.58 mg/dL (ref 0.44–1.00)
Creatinine, Ser: 0.57 mg/dL (ref 0.44–1.00)
GFR calc Af Amer: >60 mL/min (ref >60)
GFR calc Af Amer: >60 mL/min (ref >60)
GFR calc Af Amer: >60 mL/min (ref >60)
GFR calc Af Amer: >60 mL/min (ref >60)
GFR calc Af Amer: >60 mL/min (ref >60)
GFR calc non Af Amer: >60 mL/min (ref >60)
GFR calc non Af Amer: >60 mL/min (ref >60)
GFR calc non Af Amer: >60 mL/min (ref >60)
GLUCOSE: 106 mg/dL — AB (ref 65–99)
GLUCOSE: 100 mg/dL — AB (ref 65–99)
GLUCOSE: 134 mg/dL — AB (ref 65–99)
GLUCOSE: 104 mg/dL — AB (ref 65–99)
GLUCOSE: 102 mg/dL — AB (ref 65–99)
Glucose, Bld: 84 mg/dL (ref 65–99)
POTASSIUM: 3.6 mmol/L (ref 3.5–5.1)
Potassium: 3.5 mmol/L (ref 3.5–5.1)
Potassium: 3.8 mmol/L (ref 3.5–5.1)
Potassium: 3.7 mmol/L (ref 3.5–5.1)
Potassium: 3.2 mmol/L — ABNORMAL LOW (ref 3.5–5.1)
Potassium: 3.9 mmol/L (ref 3.5–5.1)
SODIUM: 130 mmol/L — AB (ref 135–145)
SODIUM: 130 mmol/L — AB (ref 135–145)
SODIUM: 131 mmol/L — AB (ref 135–145)
Sodium: 119 mmol/L — CL (ref 135–145)
Sodium: 135 mmol/L (ref 135–145)
Sodium: 132 mmol/L — ABNORMAL LOW (ref 135–145)

## 2016-02-26 LAB — URINE CULTURE

## 2016-02-26 LAB — GLUCOSE, CAPILLARY
GLUCOSE-CAPILLARY: 115 mg/dL — AB (ref 65–99)
GLUCOSE-CAPILLARY: 119 mg/dL — AB (ref 65–99)
Glucose-Capillary: 105 mg/dL — ABNORMAL HIGH (ref 65–99)
Glucose-Capillary: 115 mg/dL — ABNORMAL HIGH (ref 65–99)
Glucose-Capillary: 117 mg/dL — ABNORMAL HIGH (ref 65–99)
Glucose-Capillary: 143 mg/dL — ABNORMAL HIGH (ref 65–99)

## 2016-02-26 LAB — CBC
HEMATOCRIT: 35.8 % — AB (ref 36.0–46.0)
Hemoglobin: 12.5 g/dL (ref 12.0–15.0)
MCH: 30.8 pg (ref 26.0–34.0)
MCHC: 34.9 g/dL (ref 30.0–36.0)
MCV: 88.2 fL (ref 78.0–100.0)
PLATELETS: 242 10*3/uL (ref 150–400)
RBC: 4.06 MIL/uL (ref 3.87–5.11)
RDW: 11.9 % (ref 11.5–15.5)
WBC: 5.7 10*3/uL (ref 4.0–10.5)

## 2016-02-26 LAB — CORTISOL: Cortisol, Plasma: 18.4 ug/dL

## 2016-02-26 LAB — HEMOGLOBIN A1C
HEMOGLOBIN A1C: 5.5 % (ref 4.8–5.6)
MEAN PLASMA GLUCOSE: 111 mg/dL

## 2016-02-26 LAB — TSH: TSH: 0.615 u[IU]/mL (ref 0.350–4.500)

## 2016-02-26 MED ORDER — LORAZEPAM 2 MG/ML IJ SOLN: 1.0000 mg | Freq: Once | INTRAMUSCULAR | Status: DC | PRN

## 2016-02-26 MED ORDER — SODIUM CHLORIDE 0.9 % IV SOLN
INTRAVENOUS | Status: DC
  Administered 2016-02-26: 20:00:00 via INTRAVENOUS

## 2016-02-26 MED ORDER — POTASSIUM CHLORIDE CRYS ER 20 MEQ PO TBCR
40.0000 meq | EXTENDED_RELEASE_TABLET | Freq: Once | ORAL | Status: AC
  Administered 2016-02-26: 40 meq via ORAL
  Filled 2016-02-26: qty 2

## 2016-02-26 NOTE — Progress Notes (Signed)
PROGRESS NOTE                                                                                                                                                                                                             Patient Demographics:    Stephanie Niu, is a 55 y.o. female, DOB - APickingg 06, 1962, WUJ:[id number]  Admit date - 02/25/2016   Admitting Physician Ozella Rocks, MD  Outpatient Primary MD for the patient is [facility], MD  LOS - 1  Chief Complaint  Patient presents with  . Medication Reaction       Brief Narrative   [name] is a 55 y.o. female with a PMH of hysterectomy secondary to ovarian cysts, that presented to the ED for progressive headache, dizziness, nausea, and tremors. Found to have hyponatremia, ED suspected bactrim for recent UTI as cause.  She denied any vomiting, seizures, or LOC on admisson. Started on saline.   Subjective:   Today she is much better but notes she fells tired with no dizziness or nausea. Has an appetite and ate some breakfast. She states she drinks 4 liter of water a day.    Assessment  & Plan :   Primary Polydipsia: Secondary to daily water intake of 4 Liters.  Presented with hyponatremia of 112.  Euvolemic on exam. Orthostatic vitals shows no drop in blood pressure. Serum osmolarity is low and urine osmolarity wnl with sodium wasting. Now serum sodium is 130, rapid correction is likely due to decrease in oral intake from nausea and hospitalization.  Have stopped IVF. Holding at 130-131 for 4 hours. Will continue BMP q2h. Continue to monitor symptoms. Still cannot rule out SIADH.  Hyponatremia: Resolving. Cortisol and TSH are wnl. Continue to monitor, see above.   Hypochloremia: Resolved.   Dizziness/Tremors/Headache: . Likely secondary to hyponatremia. MRI today is negative for no acute intracranial abnormalities. Resolved  Hyperglycemia: 149 on admission, now 100.  A1c is 5.5.  Resolved with IVFs, will discontinue CBG Q4     Code Status :   Full code   Family Communication  :  No family at bedside  Disposition Plan  :   Stay inpatient- Discharge tomorrow if sodium is stable without symptoms.  Consults  :   None  Procedures  :   None  DVT Prophylaxis  :   Lovenox  Lab Results  Component Value Date   PLT 242 02/26/2016    Inpatient Medications  Scheduled Meds: . enoxaparin (LOVENOX) injection  40 mg Subcutaneous Q24H   Continuous Infusions:  PRN Meds:.acetaminophen **OR** acetaminophen, ondansetron **OR** ondansetron (ZOFRAN) IV, traZODone  Antibiotics  :    Anti-infectives    None         Objective:   Filed Vitals:   02/25/16 2102 02/25/16 2200 02/26/16 0445 02/26/16 0903  BP: 113/53  103/53 105/67  Pulse: 85  76 72  Temp: 100.6 F (38.1 C) 99.2 F (37.3 C) 98.6 F (37 C) 98.7 F (37.1 C)  TempSrc: Oral Oral Oral Oral  Resp: 16  16 18   Height:      Weight:      SpO2: 98%  98% 98%    Wt Readings from Last 3 Encounters:  02/25/16 52.617 kg (116 lb)     Intake/Output Summary (Last 24 hours) at 02/26/16 1139 Last data filed at 02/26/16 0729  Gross per 24 hour  Intake  372.5 ml  Output   3200 ml  Net -2827.5 ml     Physical Exam  Awake Alert, Oriented X 3, No new F.N deficits, Normal affect [name].AT,PERRAL Supple Neck,No JVD, No cervical lymphadenopathy appriciated.  Symmetrical Chest wall movement, Good air movement bilaterally, CTAB RRR,No Gallops,Rubs or new Murmurs, No Parasternal Heave +ve B.Sounds, Abd Soft, No tenderness, No organomegaly appriciated, No rebound - guarding or rigidity. No Cyanosis, Clubbing or edema, No new Rash or bruise     Data Review:    CBC  Recent Labs Lab 02/25/16 0959 02/26/16 1023  WBC 8.1 5.7  HGB 12.7 12.5  HCT 35.0* 35.8*  PLT 255 242  MCV 87.3 88.2  MCH 31.7 30.8  MCHC 36.3* 34.9  RDW 10.9* 11.9  LYMPHSABS 0.8  --   MONOABS 0.4  --   EOSABS 0.0  --     BASOSABS 0.0  --     Chemistries   Recent Labs Lab 02/25/16 0959 02/25/16 1330 02/25/16 1730 02/25/16 1946 02/26/16 0206 02/26/16 1023  NA 112* 113* 115* 115* 119* 130*  K 3.7 3.8 3.7 4.0 3.6 3.5  CL 82* 86* 87* 88* 91* 102  CO2 20* 20* 19* 20* 22 22  GLUCOSE 145* 127* 116* 118* 106* 84  BUN 7 6 <5* <5* <5* 8  CREATININE 0.50 0.41* 0.48 0.40* 0.50 0.73  CALCIUM 8.4* 7.5* 7.8* 8.0* 7.8* 8.4*  MG 1.5*  --   --   --   --   --   AST 31  --   --   --   --   --   ALT 18  --   --   --   --   --   ALKPHOS 60  --   --   --   --   --   BILITOT 1.2  --   --   --   --   --    ------------------------------------------------------------------------------------------------------------------ No results for input(s): CHOL, HDL, LDLCALC, TRIG, CHOLHDL, LDLDIRECT in the last 72 hours.  Lab Results  Component Value Date   HGBA1C 5.5 02/25/2016   ------------------------------------------------------------------------------------------------------------------ No results for input(s): TSH, T4TOTAL, T3FREE, THYROIDAB in the last 72 hours.  Invalid input(s): FREET3 ------------------------------------------------------------------------------------------------------------------ No results for input(s): VITAMINB12, FOLATE, FERRITIN, TIBC, IRON, RETICCTPCT in the last 72 hours.  Coagulation profile No results for input(s): INR, PROTIME in the last 168 hours.  No results for input(s): DDIMER in the last  72 hours.  Cardiac Enzymes No results for input(s): CKMB, TROPONINI, MYOGLOBIN in the last 168 hours.  Invalid input(s): CK ------------------------------------------------------------------------------------------------------------------ No results found for: BNP  Micro Results No results found for this or any previous visit (from the past 240 hour(s)).  Radiology Reports Mr Brain Wo Contrast  02/26/2016  CLINICAL DATA:  55 year old female with vertigo. Tremor dizziness and  difficulty walking since beginning Septra DS on 02/21/2016. Initial encounter. EXAM: MRI HEAD WITHOUT CONTRAST TECHNIQUE: Multiplanar, multiecho pulse sequences of the brain and surrounding structures were obtained without intravenous contrast. COMPARISON:  None. FINDINGS: Cerebral volume is within normal limits. No restricted diffusion to suggest acute infarction. No midline shift, mass effect, evidence of mass lesion, ventriculomegaly, extra-axial collection or acute intracranial hemorrhage. Cervicomedullary junction within normal limits. Partially empty sella. Major intracranial vascular flow voids appear normal. Wallace CullensGray and white matter signal is within normal limits for age throughout the brain. No cortical encephalomalacia or chronic cerebral blood products. Deep gray matter nuclei, brainstem, and cerebellum are normal. Visible internal auditory structures appear normal. Mastoids are clear. Trace paranasal sinus mucosal thickening. Bilateral buphthalmos. Otherwise negative orbits soft tissues. Negative scalp soft tissues. Negative visualized cervical spine. Normal bone marrow signal. IMPRESSION: Negative noncontrast MRI appearance of the brain. Electronically Signed   By: Odessa FlemingH  Hall M.D.   On: 02/26/2016 09:44    Time Spent in minutes  30   Debbra RidingJason Whitaker PA-S on 02/26/2016 at 11:39 AM   Supervising attending attestation HPI: Pt drink 4 L of water in a day Exam: Euvolemic, no focal deficit.  Plan: See above  I have interviewed and examined the patient and agree with the documentation and management as recorded by the Student. I have made any necessary editorial changes.I have independently reviewed the clinical findings, lab results and imaging studies.  No family was present at bedside.   Author: Lynden OxfordPranav Alisyn Lequire, MD [facility] 02/26/2016 3:02 PM

## 2016-02-27 DIAGNOSIS — R42 Dizziness and giddiness: Secondary | ICD-10-CM | POA: Diagnosis not present

## 2016-02-27 DIAGNOSIS — R251 Tremor, unspecified: Secondary | ICD-10-CM | POA: Diagnosis not present

## 2016-02-27 DIAGNOSIS — E871 Hypo-osmolality and hyponatremia: Secondary | ICD-10-CM | POA: Diagnosis not present

## 2016-02-27 LAB — BASIC METABOLIC PANEL
ANION GAP: 4 — AB (ref 5–15)
ANION GAP: 4 — AB (ref 5–15)
ANION GAP: 5 (ref 5–15)
BUN: 9 mg/dL (ref 6–20)
BUN: 9 mg/dL (ref 6–20)
BUN: 10 mg/dL (ref 6–20)
CALCIUM: 8.3 mg/dL — AB (ref 8.9–10.3)
CALCIUM: 8.3 mg/dL — AB (ref 8.9–10.3)
CALCIUM: 8.3 mg/dL — AB (ref 8.9–10.3)
CO2: 24 mmol/L (ref 22–32)
CO2: 23 mmol/L (ref 22–32)
CO2: 24 mmol/L (ref 22–32)
Chloride: 104 mmol/L (ref 101–111)
Chloride: 105 mmol/L (ref 101–111)
Chloride: 105 mmol/L (ref 101–111)
Creatinine, Ser: 0.58 mg/dL (ref 0.44–1.00)
Creatinine, Ser: 0.51 mg/dL (ref 0.44–1.00)
Creatinine, Ser: 0.51 mg/dL (ref 0.44–1.00)
GFR calc Af Amer: >60 mL/min (ref >60)
GFR calc Af Amer: >60 mL/min (ref >60)
Glucose, Bld: 102 mg/dL — ABNORMAL HIGH (ref 65–99)
Glucose, Bld: 103 mg/dL — ABNORMAL HIGH (ref 65–99)
Glucose, Bld: 99 mg/dL (ref 65–99)
POTASSIUM: 4.2 mmol/L (ref 3.5–5.1)
POTASSIUM: 4.4 mmol/L (ref 3.5–5.1)
Potassium: 4.1 mmol/L (ref 3.5–5.1)
SODIUM: 132 mmol/L — AB (ref 135–145)
SODIUM: 133 mmol/L — AB (ref 135–145)
SODIUM: 133 mmol/L — AB (ref 135–145)

## 2016-02-27 LAB — MAGNESIUM: Magnesium: 2.1 mg/dL (ref 1.7–2.4)

## 2016-02-27 MED ORDER — ACETAMINOPHEN 325 MG PO TABS: 650.0000 mg | ORAL_TABLET | Freq: Four times a day (QID) | ORAL | Status: DC | PRN

## 2016-02-27 MED ORDER — SIMETHICONE 80 MG PO CHEW: 80.0000 mg | CHEWABLE_TABLET | Freq: Four times a day (QID) | ORAL | Status: DC | PRN

## 2016-02-27 MED ORDER — MECLIZINE HCL 12.5 MG PO TABS: 12.5000 mg | ORAL_TABLET | Freq: Three times a day (TID) | ORAL | Status: DC | PRN

## 2016-02-27 MED ORDER — MECLIZINE HCL 25 MG PO TABS
12.5000 mg | ORAL_TABLET | Freq: Three times a day (TID) | ORAL | Status: DC | PRN
  Administered 2016-02-27: 12.5 mg via ORAL
  Filled 2016-02-27: qty 1

## 2016-02-27 NOTE — Discharge Summary (Signed)
[facility] Discharge Summary   Patient: [name] OZH:[id number]   PCPDonovan Kail, MD DOB: 12-10-1960   Date of admission: 02/25/2016   Date of discharge: 02/27/2016    Discharge Diagnoses:  Principal Problem:   Hyponatremia Active Problems:   Acute kidney injury (HCC)   Hyperglycemia   Hypomagnesemia   Vertigo   Tremor  Admitted From: home Disposition:  home  Recommendations for Outpatient Follow-up:  1. Please follow up with PCP in 1 week with BMP  Follow-up Information    Follow up with [facility], MD. Schedule an appointment as soon as possible for a visit in 1 week.   Specialty:  Obstetrics and Gynecology   Why:  with BMP; APPOINTMENT: DR. Tenny Craw HAS RETIRED, PER RECEIPTIONESS USING PATIENTS DOB THERE IS NO RECORD OF THIS PATIENT BEING A PATIENT AT THIS OFFICE   Contact information:   [address] [place] [address] [phone]       Follow up with Clayborn Heron, MD On 03/03/2016.   Specialty:  Family Medicine   Why:  For BMP check and follow-up. June 27th 4pm   Contact information:   [address] [place] [address] [phone]      Diet recommendation: regular diet  Activity: The patient is advised to gradually reintroduce usual activities.  Discharge Condition: good  Code Status: ful code  History of present illness: As per the H and P dictated on admission, "Stephanie Rubey is a 55 y.o. female with a PMH of hysterectomy secondary to ovarian cysts, that presented to the ED for progressive headache, dizziness, nausea, and tremors. Found to have hyponatremia, ED suspected bactrim for recent UTI as cause. She states she has 4 liters of water daily. She denied any vomiting, seizures, or LOC on admisson. Started on saline. "  Hospital Course:  Summary of her active problems in the hospital is as following.  Primary Polydipsia:  Secondary to daily water intake of 4 Liters, with acute worsening from bactrim causing diuretic  effect. Presented with hyponatremia of 112 on 6/20. Euvolemic on exam. Serum osmolarity is low and urine osmolarity wnl with sodium wasting. Orthostatic vitals shows no drop in blood pressure. Rapid correction was noted on 6/21 and monitored with BMP. Stopped IVFs initially with Sodium holding at 130. Later that evening Sodium rose to 135 and restarted IVFs Today sodium is 132. However, patient complains of dizziness. Orthostatic vitals show no drop in blood pressure today.  Cardiac telemetry shows no arrhthymias.  MRI on 6/21 has no acute intracranial abnormalities.  Will Discharge home with instructions of decreasing fluids without becoming dehydrated Discharge on meclizine for dizziness today. Follow up with PCP for BMP in 1 week and monitor fluid status.  Hyponatremia:  Resolved. Cortisol and TSH are wnl.  See above.   Hypochloremia:  Resolved with IVFs.   Dizziness/Tremors/Headache:  Likely secondary to hyponatremia.  Tremors and headaches resolved.  Has some dizziness, PRN meclizine.  Hyperglycemia:  149 on admission, now 99. A1c is 5.5.  Follow up with PCP as outpatient.   All other chronic medical condition were stable during the hospitalization.  Patient was ambulatory without any assistance. On the day of the discharge the patient's vitals were stable, and no other acute medical condition were reported by patient. the patient was felt safe to be discharge at home with family.  Procedures and Results:  none   Consultations:  none  DISCHARGE MEDICATION: Discharge Medication List as of 02/27/2016  1:09 PM  START taking these medications   Details  acetaminophen (TYLENOL) 325 MG tablet Take 2 tablets (650 mg total) by mouth every 6 (six) hours as needed for mild pain (or Fever >/= 101)., Starting 02/27/2016, Until Discontinued, OTC    meclizine (ANTIVERT) 12.5 MG tablet Take 1 tablet (12.5 mg total) by mouth 3 (three) times daily as needed for dizziness.,  Starting 02/27/2016, Until Discontinued, Normal    simethicone (GAS-X) 80 MG chewable tablet Chew 1 tablet (80 mg total) by mouth every 6 (six) hours as needed for flatulence., Starting 02/27/2016, Until Discontinued, Normal      CONTINUE these medications which have NOT CHANGED   Details  HYDROCORTISONE EX Apply 1 application topically as needed (for itching)., Until Discontinued, Historical Med    ondansetron (ZOFRAN) 4 MG tablet Take 4 mg by mouth every 8 (eight) hours as needed for nausea or vomiting., Until Discontinued, Historical Med       Allergies  Allergen Reactions  . Septra [Sulfamethoxazole-Trimethoprim] Other (See Comments)    Dizziness, generalized weakness and tremor   Discharge Instructions    Diet general    Complete by:  As directed      Discharge instructions    Complete by:  As directed   It is important that you read following instructions as well as go over your medication list with RN to help you understand your care after this hospitalization.  Discharge Instructions: Please follow-up with PCP in one week with BMP. Please minimize fluid intake without getting dehydrated.  Please request your primary care physician to go over all Hospital Tests and Procedure/Radiological results at the follow up,  Please get all Hospital records sent to your PCP by signing hospital release before you go home.   Do not drive, operating heavy machinery, perform activities at heights, swimming or participation in water activities or provide baby sitting services; until you have been seen by Primary Care Physician or a Neurologist and advised to do so again. You were cared for by a hospitalist during your hospital stay. If you have any questions about your discharge medications or the care you received while you were in the hospital after you are discharged, you can call the unit and ask to speak with the hospitalist on call if the hospitalist that took care of you is not available.    Once you are discharged, your primary care physician will handle any further medical issues. Please note that NO REFILLS for any discharge medications will be authorized once you are discharged, as it is imperative that you return to your primary care physician (or establish a relationship with a primary care physician if you do not have one) for your aftercare needs so that they can reassess your need for medications and monitor your lab values. You Must read complete instructions/literature along with all the possible adverse reactions/side effects for all the Medicines you take and that have been prescribed to you. Take any new Medicines after you have completely understood and accept all the possible adverse reactions/side effects. Wear Seat belts while driving.     Increase activity slowly    Complete by:  As directed           Discharge Exam: Filed Weights   02/25/16 0919 02/25/16 1642 02/26/16 1942  Weight: 52.164 kg (115 lb) 52.617 kg (116 lb) 50.122 kg (110 lb 8 oz)   Filed Vitals:   02/27/16 0438 02/27/16 0939  BP: 108/64   Pulse: 66   Temp: 98.4  F (36.9 C) 98.5 F (36.9 C)  Resp: 18    General: Appear in no distress, no Rash; Oral Mucosa moist. Cardiovascular: S1 and S2 Present, no Murmur, no JVD Respiratory: Bilateral Air entry present and Clear to Auscultation, no Crackles, no wheezes Abdomen: Bowel Sound present, Soft and no tenderness Extremities: no Pedal edema, no calf tenderness Neurology: Grossly no focal neuro deficit.  The results of significant diagnostics from this hospitalization (including imaging, microbiology, ancillary and laboratory) are listed below for reference.    Significant Diagnostic Studies: Mr Sherrin DaisyBrain Wo Contrast  02/26/2016  CLINICAL DATA:  55 year old female with vertigo. Tremor dizziness and difficulty walking since beginning Septra DS on 02/21/2016. Initial encounter. EXAM: MRI HEAD WITHOUT CONTRAST TECHNIQUE: Multiplanar, multiecho pulse  sequences of the brain and surrounding structures were obtained without intravenous contrast. COMPARISON:  None. FINDINGS: Cerebral volume is within normal limits. No restricted diffusion to suggest acute infarction. No midline shift, mass effect, evidence of mass lesion, ventriculomegaly, extra-axial collection or acute intracranial hemorrhage. Cervicomedullary junction within normal limits. Partially empty sella. Major intracranial vascular flow voids appear normal. Wallace CullensGray and white matter signal is within normal limits for age throughout the brain. No cortical encephalomalacia or chronic cerebral blood products. Deep gray matter nuclei, brainstem, and cerebellum are normal. Visible internal auditory structures appear normal. Mastoids are clear. Trace paranasal sinus mucosal thickening. Bilateral buphthalmos. Otherwise negative orbits soft tissues. Negative scalp soft tissues. Negative visualized cervical spine. Normal bone marrow signal. IMPRESSION: Negative noncontrast MRI appearance of the brain. Electronically Signed   By: Odessa FlemingH  Hall M.D.   On: 02/26/2016 09:44    Microbiology: Recent Results (from the past 240 hour(s))  Culture, Urine     Status: Abnormal   Collection Time: 02/25/16  7:09 PM  Result Value Ref Range Status   Specimen Description URINE, CLEAN CATCH  Final   Special Requests NONE  Final   Culture 2,000 COLONIES/mL INSIGNIFICANT GROWTH (A)  Final   Report Status 02/26/2016 FINAL  Final     Labs: CBC:  Recent Labs Lab 02/25/16 0959 02/26/16 1023  WBC 8.1 5.7  NEUTROABS 6.9  --   HGB 12.7 12.5  HCT 35.0* 35.8*  MCV 87.3 88.2  PLT 255 242   Basic Metabolic Panel:  Recent Labs Lab 02/25/16 0959  02/26/16 1624 02/26/16 2009 02/27/16 0036 02/27/16 0410 02/27/16 0413  NA 112*  < > 135 132* 133* 133* 132*  K 3.7  < > 3.2* 3.9 4.4 4.2 4.1  CL 82*  < > 106 103 104 105 105  CO2 20*  < > 24 24 24 24 23   GLUCOSE 145*  < > 104* 102* 102* 103* 99  BUN 7  < > 9 9 10 9 9     CREATININE 0.50  < > 0.63 0.57 0.58 0.51 0.51  CALCIUM 8.4*  < > 7.8* 8.3* 8.3* 8.3* 8.3*  MG 1.5*  --   --   --   --   --  2.1  < > = values in this interval not displayed. Liver Function Tests:  Recent Labs Lab 02/25/16 0959  AST 31  ALT 18  ALKPHOS 60  BILITOT 1.2  PROT 7.0  ALBUMIN 3.9   CBG:  Recent Labs Lab 02/26/16 0444 02/26/16 0902 02/26/16 1206 02/26/16 1726 02/26/16 1940  GLUCAP 119* 105* 115* 117* 143*   Time spent: 30 minutes  Debbra RidingJason Whitaker PA-S on 02/27/2016 at 11:38 AM  Supervising attending attestation  I have interviewed and examined  the patient and agree with the documentation and management as recorded by the Advanced Practice Provider. I have made any necessary editorial changes.I have independently reviewed the clinical findings, lab results and imaging studies.  family was present at bedside, opportunity was given to ask question and all questions were answered satisfactorily at the time of interview.  SignedLynden Oxford:  Jsaon Yoo  [facility] 02/27/2016 , 11:01 PM

## 2016-02-27 NOTE — Progress Notes (Signed)
Patient requesting something to use at home for belching.  MD notified.

## 2016-02-27 NOTE — Care Management Note (Signed)
Case Management Note  Patient Details  Name: [name] MRN: [id number] Date of Birth: 04/27/1961  Subjective/Objective:     CM following for progression and d/c planning.                Action/Plan: 02/27/2016 No HH or DME needs identified.   Expected Discharge Date:     02/27/2016             Expected Discharge Plan:  Home/Self Care  In-House Referral:  NA  Discharge planning Services  NA  Post Acute Care Choice:  NA Choice offered to:  NA  DME Arranged:  N/A DME Agency:  NA  HH Arranged:  NA HH Agency:  NA  Status of Service:  Completed, signed off  If discussed at Long Length of Stay Meetings, dates discussed:    Additional Comments:  Starlyn SkeansRoyal, Rosemaria Inabinet U, RN 02/27/2016, 12:20 PM

## 2016-02-27 NOTE — Progress Notes (Signed)
Discharge instructions and medications discussed with patient.  Prescription given to patient.  All questions answered.  

## 2016-10-12 ENCOUNTER — Other Ambulatory Visit: Payer: Self-pay | Admitting: Family Medicine

## 2016-10-12 DIAGNOSIS — Z1231 Encounter for screening mammogram for malignant neoplasm of breast: Secondary | ICD-10-CM

## 2016-10-17 DIAGNOSIS — J0100 Acute maxillary sinusitis, unspecified: Secondary | ICD-10-CM | POA: Diagnosis not present

## 2016-11-27 ENCOUNTER — Ambulatory Visit
Admission: RE | Admit: 2016-11-27 | Discharge: 2016-11-27 | Disposition: A | Discharge status: Home / Self Care | Payer: Commercial Managed Care - PPO | Source: Ambulatory Visit | Attending: Family Medicine | Admitting: Family Medicine

## 2016-11-27 DIAGNOSIS — Z1231 Encounter for screening mammogram for malignant neoplasm of breast: Secondary | ICD-10-CM | POA: Diagnosis not present

## 2016-12-31 DIAGNOSIS — H1032 Unspecified acute conjunctivitis, left eye: Secondary | ICD-10-CM | POA: Diagnosis not present

## 2016-12-31 DIAGNOSIS — H1013 Acute atopic conjunctivitis, bilateral: Secondary | ICD-10-CM | POA: Diagnosis not present

## 2017-01-06 DIAGNOSIS — H1045 Other chronic allergic conjunctivitis: Secondary | ICD-10-CM | POA: Diagnosis not present

## 2017-03-22 DIAGNOSIS — K219 Gastro-esophageal reflux disease without esophagitis: Secondary | ICD-10-CM | POA: Diagnosis not present

## 2017-03-22 DIAGNOSIS — R142 Eructation: Secondary | ICD-10-CM | POA: Diagnosis not present

## 2017-05-04 DIAGNOSIS — K219 Gastro-esophageal reflux disease without esophagitis: Secondary | ICD-10-CM | POA: Diagnosis not present

## 2017-05-12 DIAGNOSIS — K219 Gastro-esophageal reflux disease without esophagitis: Secondary | ICD-10-CM | POA: Diagnosis not present

## 2017-05-12 DIAGNOSIS — A048 Other specified bacterial intestinal infections: Secondary | ICD-10-CM | POA: Diagnosis not present

## 2017-06-03 IMAGING — MR MR HEAD W/O CM
9 of 10 series · 35 of 48 positions shown · non-contrast
Comparison: None.

CLINICAL DATA: 54-year-old female with vertigo. Tremor dizziness
and difficulty walking since beginning Septra DS on 02/21/2016.
Initial encounter.

EXAM:
MRI HEAD WITHOUT CONTRAST
TECHNIQUE: Multiplanar, multiecho pulse sequences of the brain and surrounding
structures were obtained without intravenous contrast.

[Series 3: T1 · sagittal · 5.0mm · 0.47mm/px · 3 of 23 slices shown]
[im 1/23]
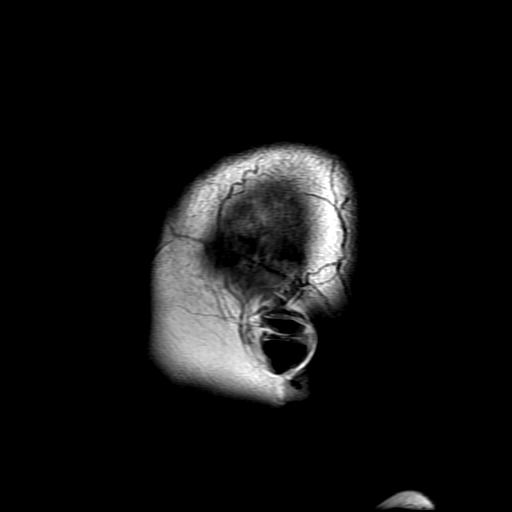
[im 12/23]
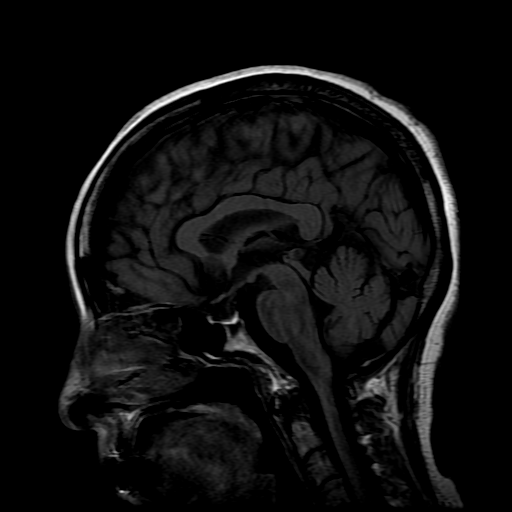
[im 23/23]
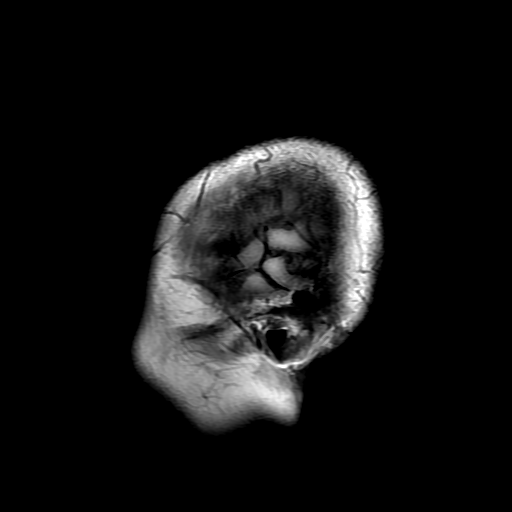

[Series 4: DWI · axial · 3.0mm · 1.09mm/px · z∈[-44,+97]mm · 8 of 98 slices shown (1 of 4)]
[im 1/98]
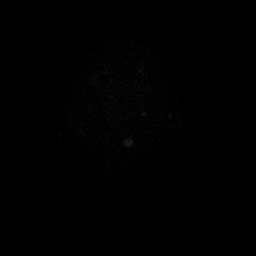
[im 11/98]
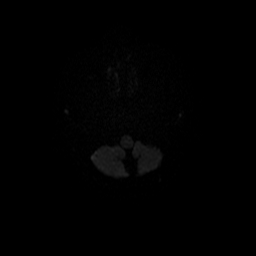
[im 33/98]
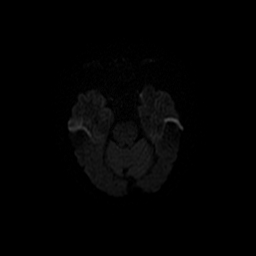
[im 44/98]
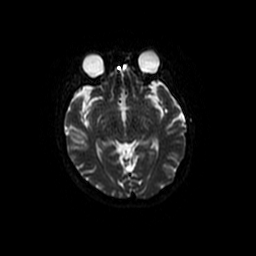
[im 54/98]
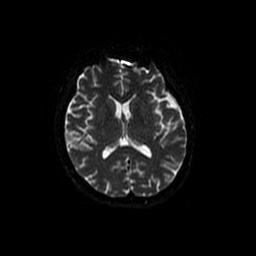
[im 65/98]
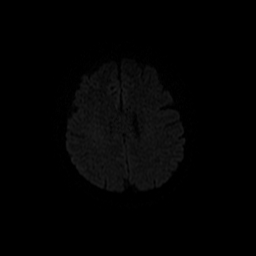
[im 87/98]
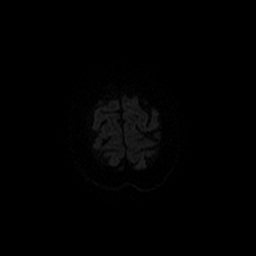
[im 98/98]
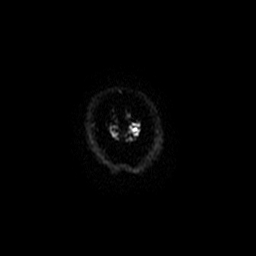

[Series 5: T2 · axial · 5.0mm · 0.43mm/px · z∈[-44,+98]mm · 3 of 25 slices shown]
[im 1/25]
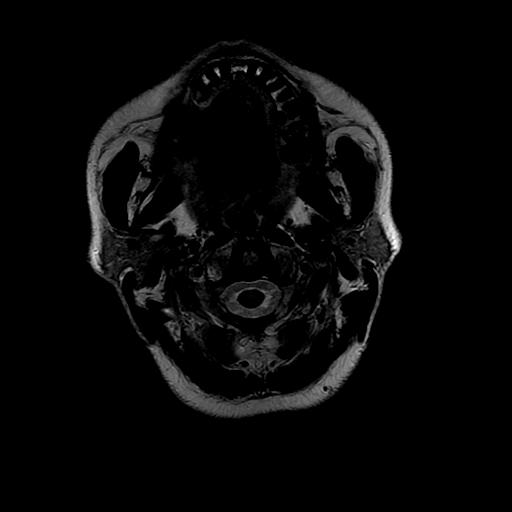
[im 13/25]
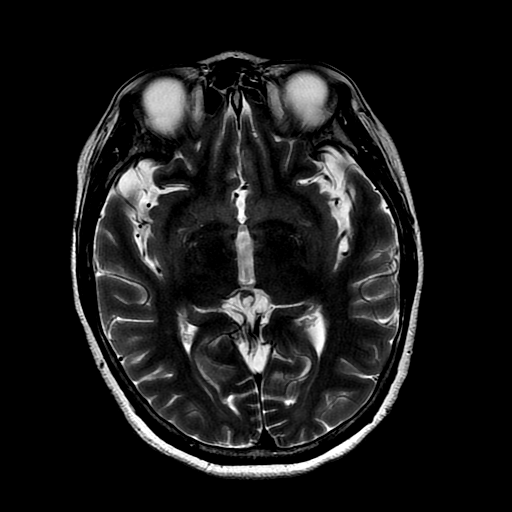
[im 25/25]
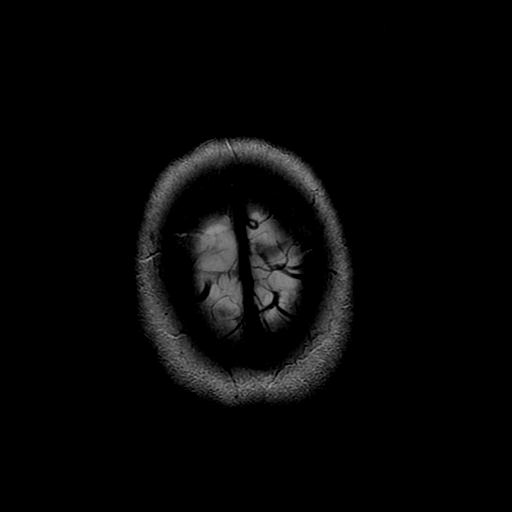

[Series 6: FLAIR · axial · 5.0mm · 0.43mm/px · z∈[-42,+109]mm · 2 of 23 slices shown]
[im 1/23]
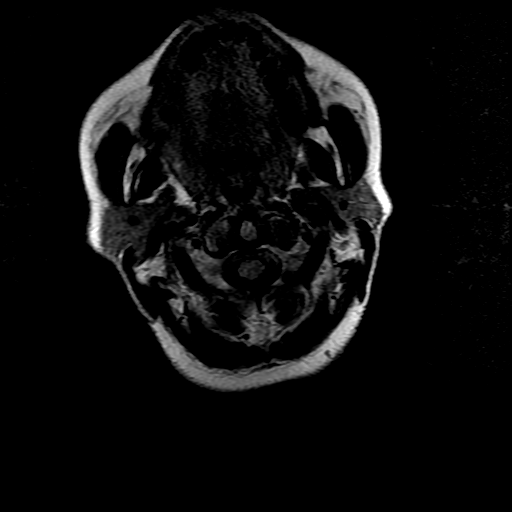
[im 23/23]
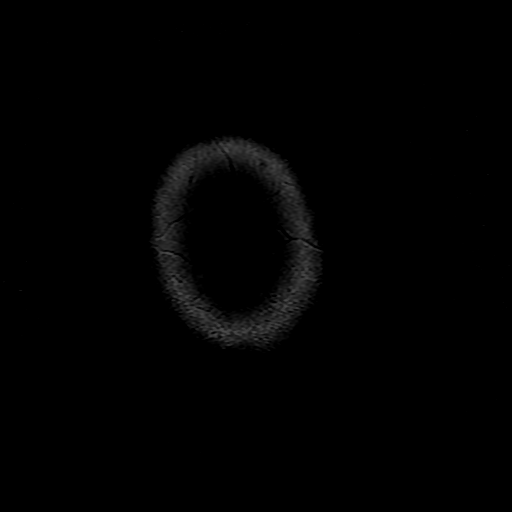

[Series 7: DWI · coronal · 5.0mm · 1.09mm/px · 7 of 66 slices shown (2 of 4)]
[im 1/66]
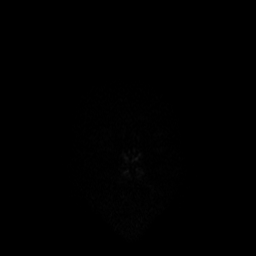
[im 11/66]
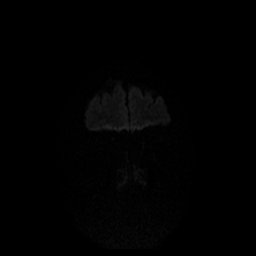
[im 22/66]
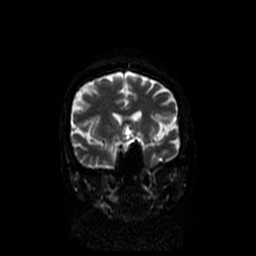
[im 33/66]
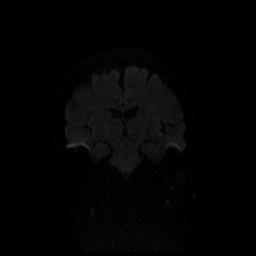
[im 44/66]
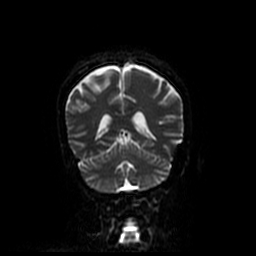
[im 55/66]
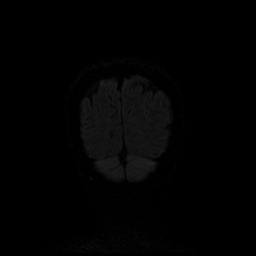
[im 66/66]
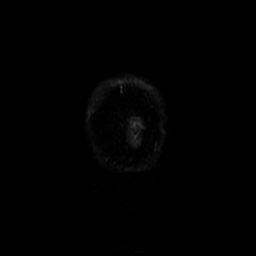

[Series 8: ax mpgr · axial · 5.0mm · 0.43mm/px · 1 of 22 slices shown]
[im 1/22]
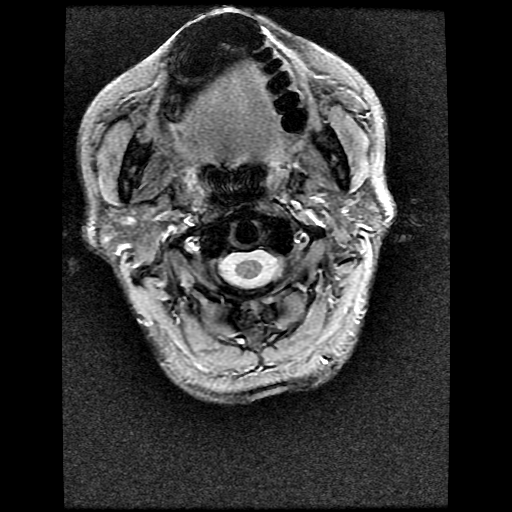

[Series 11: T2 post-contrast · coronal · 5.0mm · 0.39mm/px · 3 of 26 slices shown]
[im 1/26]
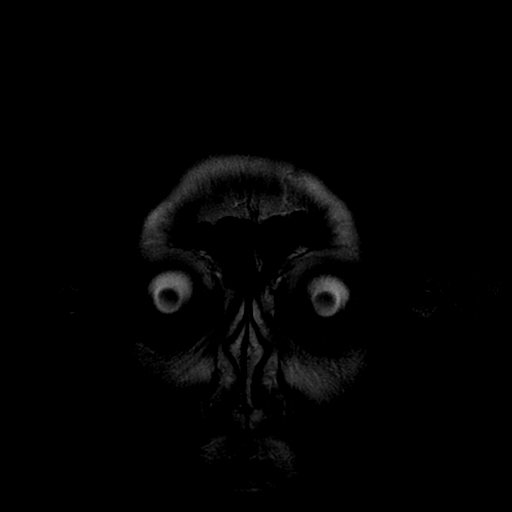
[im 13/26]
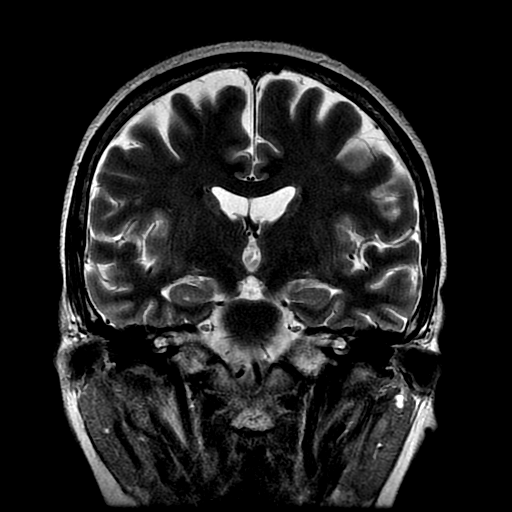
[im 26/26]
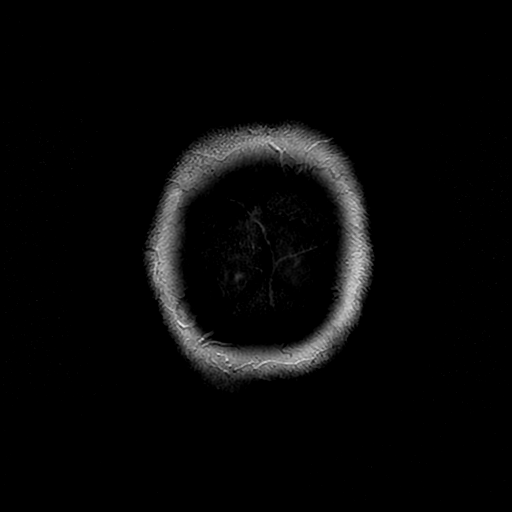

[Series 400: DWI · axial · 3.0mm · 1.09mm/px · z∈[-44,+97]mm · 5 of 49 slices shown (3 of 4)]
[im 1/49]
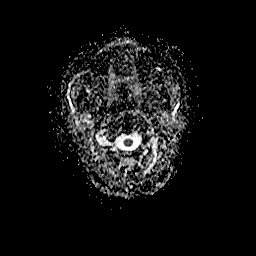
[im 13/49]
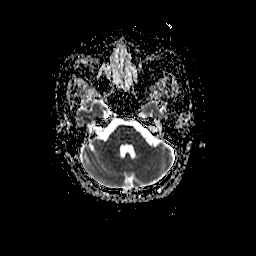
[im 25/49]
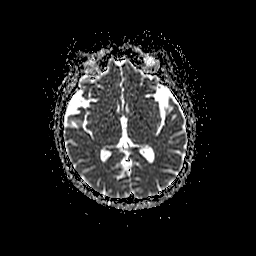
[im 37/49]
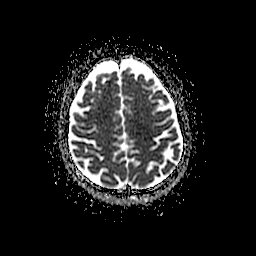
[im 49/49]
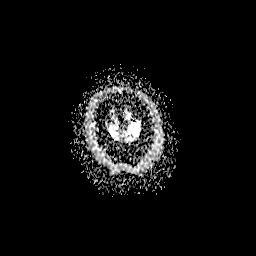

[Series 700: DWI · coronal · 5.0mm · 1.09mm/px · 3 of 33 slices shown (4 of 4)]
[im 1/33]
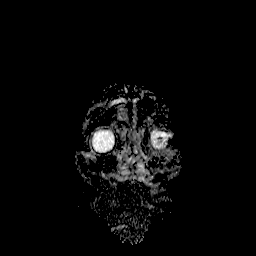
[im 17/33]
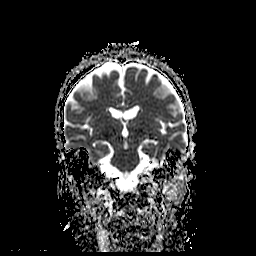
[im 33/33]
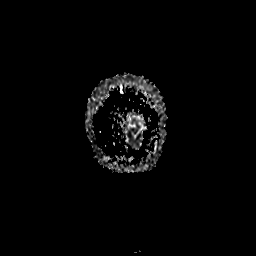

[35 of 48 positions shown; findings below may reference images not displayed]

FINDINGS: Cerebral volume is within normal limits. No restricted diffusion to
suggest acute infarction. No midline shift, mass effect, evidence of
mass lesion, ventriculomegaly, extra-axial collection or acute
intracranial hemorrhage. Cervicomedullary junction within normal
limits. Partially empty sella. Major intracranial vascular flow
voids appear normal. Gray and white matter signal is within normal
limits for age throughout the brain. No cortical encephalomalacia or
chronic cerebral blood products. Deep gray matter nuclei, brainstem,
and cerebellum are normal. Visible internal auditory structures
appear normal.

Mastoids are clear. Trace paranasal sinus mucosal thickening.
Bilateral buphthalmos. Otherwise negative orbits soft tissues.
Negative scalp soft tissues. Negative visualized cervical spine.
Normal bone marrow signal.
IMPRESSION: Negative noncontrast MRI appearance of the brain.

## 2017-06-21 DIAGNOSIS — K219 Gastro-esophageal reflux disease without esophagitis: Secondary | ICD-10-CM | POA: Diagnosis not present

## 2017-06-21 DIAGNOSIS — A048 Other specified bacterial intestinal infections: Secondary | ICD-10-CM | POA: Diagnosis not present

## 2017-08-12 DIAGNOSIS — K219 Gastro-esophageal reflux disease without esophagitis: Secondary | ICD-10-CM | POA: Diagnosis not present

## 2017-08-12 DIAGNOSIS — K2930 Chronic superficial gastritis without bleeding: Secondary | ICD-10-CM | POA: Diagnosis not present

## 2017-12-20 DIAGNOSIS — K219 Gastro-esophageal reflux disease without esophagitis: Secondary | ICD-10-CM | POA: Diagnosis not present

## 2017-12-20 DIAGNOSIS — R5382 Chronic fatigue, unspecified: Secondary | ICD-10-CM | POA: Diagnosis not present

## 2017-12-30 DIAGNOSIS — K219 Gastro-esophageal reflux disease without esophagitis: Secondary | ICD-10-CM | POA: Diagnosis not present

## 2017-12-30 DIAGNOSIS — J309 Allergic rhinitis, unspecified: Secondary | ICD-10-CM | POA: Diagnosis not present

## 2018-03-21 ENCOUNTER — Other Ambulatory Visit: Payer: Self-pay | Admitting: Family Medicine

## 2018-03-21 DIAGNOSIS — Z1231 Encounter for screening mammogram for malignant neoplasm of breast: Secondary | ICD-10-CM

## 2018-04-14 ENCOUNTER — Ambulatory Visit
Admission: RE | Admit: 2018-04-14 | Discharge: 2018-04-14 | Disposition: A | Discharge status: Home / Self Care | Payer: Commercial Managed Care - PPO | Source: Ambulatory Visit | Attending: Family Medicine | Admitting: Family Medicine

## 2018-04-14 DIAGNOSIS — Z1231 Encounter for screening mammogram for malignant neoplasm of breast: Secondary | ICD-10-CM | POA: Diagnosis not present

## 2018-04-15 ENCOUNTER — Ambulatory Visit: Payer: Commercial Managed Care - PPO

## 2018-06-16 DIAGNOSIS — M25529 Pain in unspecified elbow: Secondary | ICD-10-CM | POA: Diagnosis not present

## 2018-06-16 DIAGNOSIS — Z23 Encounter for immunization: Secondary | ICD-10-CM | POA: Diagnosis not present

## 2018-08-03 DIAGNOSIS — J019 Acute sinusitis, unspecified: Secondary | ICD-10-CM | POA: Diagnosis not present

## 2018-10-10 DIAGNOSIS — K649 Unspecified hemorrhoids: Secondary | ICD-10-CM | POA: Diagnosis not present

## 2018-12-12 DIAGNOSIS — H43392 Other vitreous opacities, left eye: Secondary | ICD-10-CM | POA: Diagnosis not present

## 2018-12-12 DIAGNOSIS — H43812 Vitreous degeneration, left eye: Secondary | ICD-10-CM | POA: Diagnosis not present

## 2020-05-20 ENCOUNTER — Other Ambulatory Visit: Payer: Self-pay | Admitting: Family Medicine

## 2020-05-20 DIAGNOSIS — Z1231 Encounter for screening mammogram for malignant neoplasm of breast: Secondary | ICD-10-CM

## 2020-06-04 ENCOUNTER — Ambulatory Visit
Admission: RE | Admit: 2020-06-04 | Discharge: 2020-06-04 | Disposition: A | Discharge status: Home / Self Care | Payer: Managed Care, Other (non HMO) | Source: Ambulatory Visit | Attending: Family Medicine | Admitting: Family Medicine

## 2020-06-04 ENCOUNTER — Other Ambulatory Visit: Payer: Self-pay

## 2020-06-04 DIAGNOSIS — Z1231 Encounter for screening mammogram for malignant neoplasm of breast: Secondary | ICD-10-CM

## 2020-06-05 ENCOUNTER — Ambulatory Visit: Payer: Self-pay

## 2021-02-25 ENCOUNTER — Other Ambulatory Visit: Payer: Self-pay

## 2021-02-25 ENCOUNTER — Encounter: Payer: Self-pay | Admitting: Family Medicine

## 2021-02-25 ENCOUNTER — Emergency Department
Admission: EM | Admit: 2021-02-25 | Discharge: 2021-02-25 | Disposition: A | Discharge status: Home / Self Care | Payer: Managed Care, Other (non HMO) | Source: Home / Self Care

## 2021-02-25 DIAGNOSIS — N3001 Acute cystitis with hematuria: Secondary | ICD-10-CM | POA: Diagnosis not present

## 2021-02-25 LAB — POCT URINALYSIS DIP (MANUAL ENTRY)
Glucose, UA: NEGATIVE mg/dL
Nitrite, UA: POSITIVE — AB
Protein Ur, POC: >=300 mg/dL — AB
Spec Grav, UA: 1.02 (ref 1.010–1.025)
Urobilinogen, UA: 2 E.U./dL — AB
pH, UA: 6 (ref 5.0–8.0)

## 2021-02-25 MED ORDER — NITROFURANTOIN MONOHYD MACRO 100 MG PO CAPS: 100.0000 mg | ORAL_CAPSULE | Freq: Two times a day (BID) | ORAL | 0 refills | Status: DC

## 2021-02-25 NOTE — Discharge Instructions (Addendum)
Advised/instructed patient to take medication as directed with food to completion.  Encourage patient increase daily water intake while taking medication.  Advised/encouraged patient if she develops flank pain and is accompanied by fever please go to nearest ED or here for further evaluation.

## 2021-02-25 NOTE — ED Triage Notes (Signed)
Patient c/o dysuria, urinary frequency and hematuria  since yesterday.  Patient denies any OTC meds.

## 2021-02-25 NOTE — ED Provider Notes (Signed)
[name]    CSN: [id number] Arrival date & time: 02/25/21  0847      History   Chief Complaint Chief Complaint  Patient presents with  . Urinary Frequency    HPI [name] is a 60 y.o. female.   HPI 60 year old female presents with dysuria, urinary frequency, and hematuria since yesterday.  Patient denies taking any OTC medications for her symptoms. PMH significant for acute kidney injury.  Past Medical History:  Diagnosis Date  . Acute kidney injury (HCC)   . Hyperglycemia   . Hyponatremia   . Ovarian cyst     Patient Active Problem List   Diagnosis Date Noted  . Hyponatremia 02/25/2016  . Hypomagnesemia 02/25/2016  . Acute kidney injury (HCC)   . Hyperglycemia   . Vertigo   . Tremor     Past Surgical History:  Procedure Laterality Date  . ABDOMINAL HYSTERECTOMY      OB History   No obstetric history on file.      Home Medications    Prior to Admission medications   Medication Sig Start Date End Date Taking? Authorizing Provider  acetaminophen (TYLENOL) 325 MG tablet Take 2 tablets (650 mg total) by mouth every 6 (six) hours as needed for mild pain (or Fever >/= 101). 02/27/16  Yes Rolly Salter, MD  HYDROCORTISONE EX Apply 1 application topically as needed (for itching).   Yes [provider]  meclizine (ANTIVERT) 12.5 MG tablet Take 1 tablet (12.5 mg total) by mouth 3 (three) times daily as needed for dizziness. 02/27/16  Yes Rolly Salter, MD  nitrofurantoin, macrocrystal-monohydrate, (MACROBID) 100 MG capsule Take 1 capsule (100 mg total) by mouth 2 (two) times daily. 02/25/21  Yes Trevor Iha, FNP  ondansetron (ZOFRAN) 4 MG tablet Take 4 mg by mouth every 8 (eight) hours as needed for nausea or vomiting.   Yes [provider]  simethicone (GAS-X) 80 MG chewable tablet Chew 1 tablet (80 mg total) by mouth every 6 (six) hours as needed for flatulence. 02/27/16  Yes Rolly Salter, MD    Family  History Family History  Problem Relation Age of Onset  . Hypertension Other   . Diabetes Other   . Cancer Other   . Breast cancer Mother     Social History Social History   Tobacco Use  . Smoking status: Never  . Smokeless tobacco: Never  Substance Use Topics  . Alcohol use: No  . Drug use: No     Allergies   Septra [sulfamethoxazole-trimethoprim]   Review of Systems Review of Systems  Constitutional: Negative.   HENT: Negative.    Eyes: Negative.   Respiratory: Negative.    Cardiovascular: Negative.   Gastrointestinal: Negative.   Genitourinary:  Positive for dysuria, hematuria and urgency.  Musculoskeletal: Negative.   Skin: Negative.   Neurological: Negative.     Physical Exam Triage Vital Signs ED Triage Vitals  Enc Vitals Group     BP      Pulse      Resp      Temp      Temp src      SpO2      Weight      Height      Head Circumference      Peak Flow      Pain Score      Pain Loc      Pain Edu?      Excl. in GC?  No data found.  Updated Vital Signs BP 138/87 (BP Location: Left Arm)   Pulse 94   Temp 99.3 F (37.4 C) (Oral)   SpO2 99%   Physical Exam Vitals and nursing note reviewed.  Constitutional:      General: She is not in acute distress.    Appearance: Normal appearance. She is normal weight. She is not ill-appearing.  HENT:     Head: Normocephalic and atraumatic.     Mouth/Throat:     Mouth: Mucous membranes are moist.     Pharynx: Oropharynx is clear.  Eyes:     Extraocular Movements: Extraocular movements intact.     Conjunctiva/sclera: Conjunctivae normal.     Pupils: Pupils are equal, round, and reactive to light.  Cardiovascular:     Rate and Rhythm: Normal rate and regular rhythm.     Pulses: Normal pulses.     Heart sounds: Normal heart sounds. No murmur heard. Pulmonary:     Effort: Pulmonary effort is normal. No respiratory distress.     Breath sounds: Normal breath sounds. No wheezing, rhonchi or rales.   Abdominal:     Tenderness: There is no right CVA tenderness or left CVA tenderness.  Musculoskeletal:        General: Normal range of motion.     Cervical back: Normal range of motion and neck supple. No tenderness.  Lymphadenopathy:     Cervical: No cervical adenopathy.  Skin:    General: Skin is warm and dry.  Neurological:     General: No focal deficit present.     Mental Status: She is alert and oriented to person, place, and time.  Psychiatric:        Mood and Affect: Mood normal.        Behavior: Behavior normal.     UC Treatments / Results  Labs (all labs ordered are listed, but only abnormal results are displayed) Labs Reviewed  POCT URINALYSIS DIP (MANUAL ENTRY) - Abnormal; Notable for the following components:      Result Value   Color, UA red (*)    Clarity, UA cloudy (*)    Bilirubin, UA large (*)    Ketones, POC UA small (15) (*)    Blood, UA large (*)    Protein Ur, POC >=300 (*)    Urobilinogen, UA 2.0 (*)    Nitrite, UA Positive (*)    Leukocytes, UA Large (3+) (*)    All other components within normal limits  URINE CULTURE    EKG   Radiology No results found.  Procedures Procedures (including critical care time)  Medications Ordered in UC Medications - No data to display  Initial Impression / Assessment and Plan / UC Course  I have reviewed the triage vital signs and the nursing notes.  Pertinent labs & imaging results that were available during my care of the patient were reviewed by me and considered in my medical decision making (see chart for details).    MDM: 1.  Acute cystitis with hematuria.  Patient discharged home, hemodynamically stable.  Final Clinical Impressions(s) / UC Diagnoses   Final diagnoses:  Acute cystitis with hematuria     Discharge Instructions      Advised/instructed patient to take medication as directed with food to completion.  Encourage patient increase daily water intake while taking medication.   Advised/encouraged patient if she develops flank pain and is accompanied by fever please go to nearest ED or here for further evaluation.  ED Prescriptions     Medication Sig Dispense Auth. Provider   nitrofurantoin, macrocrystal-monohydrate, (MACROBID) 100 MG capsule Take 1 capsule (100 mg total) by mouth 2 (two) times daily. 10 capsule Trevor Iha, FNP      PDMP not reviewed this encounter.   Trevor Iha, FNP 02/25/21 1005

## 2021-02-27 LAB — URINE CULTURE
MICRO NUMBER:: 12031853
SPECIMEN QUALITY:: ADEQUATE

## 2021-05-16 ENCOUNTER — Emergency Department
Admission: EM | Admit: 2021-05-16 | Discharge: 2021-05-16 | Disposition: A | Discharge status: Home / Self Care | Payer: Managed Care, Other (non HMO) | Source: Home / Self Care

## 2021-05-16 ENCOUNTER — Emergency Department (INDEPENDENT_AMBULATORY_CARE_PROVIDER_SITE_OTHER): Payer: Managed Care, Other (non HMO)

## 2021-05-16 ENCOUNTER — Other Ambulatory Visit: Payer: Self-pay

## 2021-05-16 ENCOUNTER — Encounter: Payer: Self-pay | Admitting: Emergency Medicine

## 2021-05-16 DIAGNOSIS — S92514A Nondisplaced fracture of proximal phalanx of right lesser toe(s), initial encounter for closed fracture: Secondary | ICD-10-CM

## 2021-05-16 DIAGNOSIS — M79674 Pain in right toe(s): Secondary | ICD-10-CM

## 2021-05-16 DIAGNOSIS — M7989 Other specified soft tissue disorders: Secondary | ICD-10-CM

## 2021-05-16 NOTE — ED Provider Notes (Signed)
[name]    CSN: [id number] Arrival date & time: 05/16/21  1328      History   Chief Complaint Chief Complaint  Patient presents with   Toe Injury    Right 5th toe     HPI [name] is a 60 y.o. female.   HPI 60 year old female presents with right fifth toe injury sustained on Monday, 05/12/2021.  Patient reports pain and swelling noted on outside of right foot and below fifth toe.  Reports pain is increased with weightbearing.  Past Medical History:  Diagnosis Date   Acute kidney injury (HCC)    Hyperglycemia    Hyponatremia    Ovarian cyst     Patient Active Problem List   Diagnosis Date Noted   Hyponatremia 02/25/2016   Hypomagnesemia 02/25/2016   Acute kidney injury (HCC)    Hyperglycemia    Vertigo    Tremor     Past Surgical History:  Procedure Laterality Date   ABDOMINAL HYSTERECTOMY      OB History   No obstetric history on file.      Home Medications    Prior to Admission medications   Medication Sig Start Date End Date Taking? Authorizing Provider  cholecalciferol (VITAMIN D3) 25 MCG (1000 UNIT) tablet Take 2,000 Units by mouth daily.   Yes [provider]  esomeprazole (NEXIUM) 20 MG capsule Take 20 mg by mouth daily at 12 noon.   Yes [provider]  acetaminophen (TYLENOL) 325 MG tablet Take 2 tablets (650 mg total) by mouth every 6 (six) hours as needed for mild pain (or Fever >/= 101). Patient not taking: Reported on 05/16/2021 02/27/16   Rolly Salter, MD  HYDROCORTISONE EX Apply 1 application topically as needed (for itching). Patient not taking: Reported on 05/16/2021    [provider]  meclizine (ANTIVERT) 12.5 MG tablet Take 1 tablet (12.5 mg total) by mouth 3 (three) times daily as needed for dizziness. Patient not taking: Reported on 05/16/2021 02/27/16   Rolly Salter, MD  nitrofurantoin, macrocrystal-monohydrate, (MACROBID) 100 MG capsule Take 1 capsule (100 mg total) by mouth 2  (two) times daily. Patient not taking: Reported on 05/16/2021 02/25/21   Trevor Iha, FNP  ondansetron (ZOFRAN) 4 MG tablet Take 4 mg by mouth every 8 (eight) hours as needed for nausea or vomiting. Patient not taking: Reported on 05/16/2021    [provider]  simethicone (GAS-X) 80 MG chewable tablet Chew 1 tablet (80 mg total) by mouth every 6 (six) hours as needed for flatulence. Patient not taking: Reported on 05/16/2021 02/27/16   Rolly Salter, MD    Family History Family History  Problem Relation Age of Onset   Breast cancer Mother    Hypertension Other    Diabetes Other    Cancer Other     Social History Social History   Tobacco Use   Smoking status: Never   Smokeless tobacco: Never  Substance Use Topics   Alcohol use: No   Drug use: No     Allergies   Septra [sulfamethoxazole-trimethoprim]   Review of Systems Review of Systems  Musculoskeletal:        Right foot fifth toe pain x 4 days    Physical Exam Triage Vital Signs ED Triage Vitals  Enc Vitals Group     BP 05/16/21 1402 (!) 165/79     Pulse Rate 05/16/21 1402 96     Resp 05/16/21 1402 16  Temp 05/16/21 1402 99.5 F (37.5 C)     Temp Source 05/16/21 1402 Oral     SpO2 05/16/21 1402 99 %     Weight 05/16/21 1407 106 lb (48.1 kg)     Height 05/16/21 1407 5\' 2"  (1.575 m)     Head Circumference --      Peak Flow --      Pain Score 05/16/21 1406 3     Pain Loc --      Pain Edu? --      Excl. in GC? --    No data found.  Updated Vital Signs BP (!) 165/79 (BP Location: Right Arm)   Pulse 96   Temp 99.5 F (37.5 C) (Oral)   Resp 16   Ht 5\' 2"  (1.575 m)   Wt 106 lb (48.1 kg)   SpO2 99%   BMI 19.39 kg/m   Physical Exam Vitals and nursing note reviewed.  Constitutional:      General: She is not in acute distress.    Appearance: Normal appearance. She is normal weight. She is not ill-appearing.  HENT:     Head: Normocephalic and atraumatic.     Mouth/Throat:     Mouth:  Mucous membranes are moist.     Pharynx: Oropharynx is clear.  Eyes:     Extraocular Movements: Extraocular movements intact.     Conjunctiva/sclera: Conjunctivae normal.     Pupils: Pupils are equal, round, and reactive to light.  Cardiovascular:     Rate and Rhythm: Normal rate and regular rhythm.     Pulses: Normal pulses.     Heart sounds: Normal heart sounds.  Pulmonary:     Effort: Pulmonary effort is normal.     Breath sounds: Normal breath sounds.  Musculoskeletal:        General: Signs of injury present. No swelling or deformity.     Cervical back: Normal range of motion and neck supple.     Comments: Right foot fifth toe (dorsum over proximal phalanx): Mild TTP, no soft tissue swelling noted  Skin:    General: Skin is warm and dry.  Neurological:     General: No focal deficit present.     Mental Status: She is alert and oriented to person, place, and time.  Psychiatric:        Mood and Affect: Mood normal.        Behavior: Behavior normal.     UC Treatments / Results  Labs (all labs ordered are listed, but only abnormal results are displayed) Labs Reviewed - No data to display  EKG   Radiology DG Foot Complete Right  Result Date: 05/16/2021 CLINICAL DATA:  Pain and swelling around the fifth toe. EXAM: RIGHT FOOT COMPLETE - 3+ VIEW COMPARISON:  None. FINDINGS: There is a acute fracture involving the mid shaft of the fifth proximal phalanx. The fracture is in near anatomic alignment. No dislocation. No additional fracture or dislocation. No significant arthropathy. IMPRESSION: Acute fracture involves the mid shaft of the fifth proximal phalanx. Electronically Signed   By: M.D.   On: 05/16/2021 15:03    Procedures Procedures (including critical care time)  Medications Ordered in UC Medications - No data to display  Initial Impression / Assessment and Plan / UC Course  I have reviewed the triage vital signs and the nursing notes.  Pertinent labs &  imaging results that were available during my care of the patient were reviewed by me and considered in  my medical decision making (see chart for details).     MDM: 1.  Pain in right toe right foot x-ray reveals acute fracture involving midshaft of fifth proximal phalanx; 2.  Closed displaced fracture of phalanx of fifth toe unspecified phalanx-patient placed in right postop shoe and is previously scheduled with  orthopedic provider on Tuesday, 05/20/2021. Final Clinical Impressions(s) / UC Diagnoses   Final diagnoses:  Pain in toe of right foot  Nondisplaced fracture of proximal phalanx of right lesser toe(s), initial encounter for closed fracture     Discharge Instructions      Advised patient to wear right postop shoe 24/7 except when bathing) until being evaluated by orthopedic provider. Advised patient may use OTC Tylenol and/or OTC Ibuprofen for right fifth toe pain.  Patient may RICE right foot for 25 minutes 3 times daily for the next 3 days.  She reports she is scheduled to see [facility] orthopedic provider on Tuesday of next week, 05/20/2021.     ED Prescriptions   None    PDMP not reviewed this encounter.   Trevor Iha, FNP 05/16/21 1606

## 2021-05-16 NOTE — ED Notes (Signed)
Pt given instructions on how to use an ice pack w/ post op shoe. Pt requested a second post op shoe to wear at home. RN explained insurance would not cover 2nd shoe - pt will check at a medical supply store or on [employer]

## 2021-05-16 NOTE — Discharge Instructions (Addendum)
Advised patient to wear right postop shoe 24/7 except when bathing) until being evaluated by orthopedic provider. Advised patient may use OTC Tylenol and/or OTC Ibuprofen for right fifth toe pain.  Patient may RICE right foot for 25 minutes 3 times daily for the next 3 days.  She reports she is scheduled to see [facility] orthopedic provider on Tuesday of next week, 05/20/2021.

## 2021-05-16 NOTE — ED Triage Notes (Signed)
Pt hit her 5th toe on right on Monday on garage door  Pain and swelling noted to outside right foot and below 5th toe No OTC meds for pain - refused tylenol in triage Pain increase when bearing weight COVID vaccine x 3

## 2021-05-20 ENCOUNTER — Encounter: Payer: Managed Care, Other (non HMO) | Admitting: Sports Medicine

## 2021-05-21 ENCOUNTER — Ambulatory Visit: Payer: Managed Care, Other (non HMO) | Admitting: Sports Medicine

## 2021-05-21 DIAGNOSIS — S92514A Nondisplaced fracture of proximal phalanx of right lesser toe(s), initial encounter for closed fracture: Secondary | ICD-10-CM | POA: Diagnosis not present

## 2021-05-21 DIAGNOSIS — S92919A Unspecified fracture of unspecified toe(s), initial encounter for closed fracture: Secondary | ICD-10-CM | POA: Insufficient documentation

## 2021-05-21 NOTE — Progress Notes (Signed)
    Procedures performed today:    None.  Independent interpretation of notes and tests performed by another provider:   X-rays personally reviewed and show a nondisplaced, nonangulated fracture through the right fifth proximal phalanx.  Brief History, Exam, Impression, and Recommendations:    Closed fracture of right fifth proximal phalanx This 61 year old female excellently kicked a door frame, she had some pain and swelling, bruising, she was seen in urgent care where x-rays showed a nondisplaced nonangulated fracture through the right fifth proximal phalanx, placed in a postop shoe and referred to me. She is doing well, she has a bit of tenderness over the fracture, I have advised her to continue the postop shoe until next week at which point she can just use buddy tape at a regular shoe, she would like to work from home, this is her driving foot as I think that acceptable. Tylenol as needed, return to see me in 6 weeks.    ___________________________________________ Ihor Austin. Benjamin Stain, M.D., ABFM., CAQSM. Primary Care and Sports Medicine [facility]  Adjunct Instructor of Family Medicine  [employer]

## 2021-05-21 NOTE — Assessment & Plan Note (Signed)
This 60 year old female excellently kicked a door frame, she had some pain and swelling, bruising, she was seen in urgent care where x-rays showed a nondisplaced nonangulated fracture through the right fifth proximal phalanx, placed in a postop shoe and referred to me. She is doing well, she has a bit of tenderness over the fracture, I have advised her to continue the postop shoe until next week at which point she can just use buddy tape at a regular shoe, she would like to work from home, this is her driving foot as I think that acceptable. Tylenol as needed, return to see me in 6 weeks.

## 2021-05-22 ENCOUNTER — Telehealth: Payer: Self-pay

## 2021-05-22 NOTE — Telephone Encounter (Signed)
Patient called to ask questions regarding providers instructions for post op shoe and buddy tape. Questions answered from avs.

## 2021-05-26 ENCOUNTER — Telehealth: Payer: Self-pay

## 2021-05-26 NOTE — Telephone Encounter (Signed)
Sure, she can continue it for another couple of weeks.

## 2021-05-26 NOTE — Telephone Encounter (Signed)
Patient called to question if you knew that she has only been in the post op shoe since 05/16/21. She reports that it still hurts to walk. She was advised (per you note) to discontinue the post op shoe on Wednesday of this week and start buddy taping. I get the impression she feels like she should be in the post op shoe longer.

## 2021-05-27 NOTE — Telephone Encounter (Signed)
Patient aware she can go to the store when necessary and make sure to elevate when she returns home to alleviate any potential swelling.

## 2021-07-02 ENCOUNTER — Ambulatory Visit: Payer: Managed Care, Other (non HMO) | Admitting: Sports Medicine

## 2021-07-02 ENCOUNTER — Other Ambulatory Visit: Payer: Self-pay

## 2021-07-02 DIAGNOSIS — S92514D Nondisplaced fracture of proximal phalanx of right lesser toe(s), subsequent encounter for fracture with routine healing: Secondary | ICD-10-CM | POA: Diagnosis not present

## 2021-07-02 NOTE — Assessment & Plan Note (Signed)
This 60 year old female returns, she had accidentally kicked a door frame approximately 6 weeks ago. She had a nondisplaced, nonangulated transverse fracture through the fifth proximal phalanx of the right foot. She was doing really well at the last visit, today we have discontinued buddy taping, she has no tenderness over the fracture, she still has a bit of swelling which is normal, I explained that this could last for months. She would really like to continue to work from home for another 8 weeks, I not really see a problem with this so I will write a note, otherwise I do not think she needs any additional x-rays or appointments with me. Return as needed.

## 2021-07-02 NOTE — Progress Notes (Signed)
    Procedures performed today:    None.  Independent interpretation of notes and tests performed by another provider:   None.  Brief History, Exam, Impression, and Recommendations:    Closed fracture of right fifth proximal phalanx This 60 year old female returns, she had accidentally kicked a door frame approximately 6 weeks ago. She had a nondisplaced, nonangulated transverse fracture through the fifth proximal phalanx of the right foot. She was doing really well at the last visit, today we have discontinued buddy taping, she has no tenderness over the fracture, she still has a bit of swelling which is normal, I explained that this could last for months. She would really like to continue to work from home for another 8 weeks, I not really see a problem with this so I will write a note, otherwise I do not think she needs any additional x-rays or appointments with me. Return as needed.    ___________________________________________ Ihor Austin. Benjamin Stain, M.D., ABFM., CAQSM. Primary Care and Sports Medicine [facility]  Adjunct Instructor of Family Medicine  [employer]

## 2021-12-01 ENCOUNTER — Other Ambulatory Visit: Payer: Self-pay | Admitting: Family Medicine

## 2021-12-01 DIAGNOSIS — Z1231 Encounter for screening mammogram for malignant neoplasm of breast: Secondary | ICD-10-CM

## 2021-12-03 ENCOUNTER — Other Ambulatory Visit: Payer: Self-pay | Admitting: Family Medicine

## 2021-12-03 DIAGNOSIS — Z1231 Encounter for screening mammogram for malignant neoplasm of breast: Secondary | ICD-10-CM

## 2021-12-04 ENCOUNTER — Ambulatory Visit: Payer: Managed Care, Other (non HMO)

## 2022-01-08 ENCOUNTER — Ambulatory Visit (INDEPENDENT_AMBULATORY_CARE_PROVIDER_SITE_OTHER): Payer: Managed Care, Other (non HMO)

## 2022-01-08 DIAGNOSIS — Z1231 Encounter for screening mammogram for malignant neoplasm of breast: Secondary | ICD-10-CM | POA: Diagnosis not present

## 2022-03-31 ENCOUNTER — Other Ambulatory Visit: Payer: Self-pay | Admitting: Family Medicine

## 2022-03-31 DIAGNOSIS — Z0000 Encounter for general adult medical examination without abnormal findings: Secondary | ICD-10-CM

## 2022-04-09 ENCOUNTER — Ambulatory Visit (INDEPENDENT_AMBULATORY_CARE_PROVIDER_SITE_OTHER): Payer: Self-pay

## 2022-04-09 DIAGNOSIS — Z0000 Encounter for general adult medical examination without abnormal findings: Secondary | ICD-10-CM

## 2022-06-10 ENCOUNTER — Ambulatory Visit
Admission: EM | Admit: 2022-06-10 | Discharge: 2022-06-10 | Disposition: A | Discharge status: Home / Self Care | Payer: Managed Care, Other (non HMO) | Attending: Family Medicine | Admitting: Family Medicine

## 2022-06-10 ENCOUNTER — Encounter: Payer: Self-pay | Admitting: Emergency Medicine

## 2022-06-10 DIAGNOSIS — Z20822 Contact with and (suspected) exposure to covid-19: Secondary | ICD-10-CM | POA: Diagnosis present

## 2022-06-10 DIAGNOSIS — J028 Acute pharyngitis due to other specified organisms: Secondary | ICD-10-CM | POA: Diagnosis present

## 2022-06-10 DIAGNOSIS — B9789 Other viral agents as the cause of diseases classified elsewhere: Secondary | ICD-10-CM | POA: Insufficient documentation

## 2022-06-10 LAB — POC SARS CORONAVIRUS 2 AG -  ED: SARS Coronavirus 2 Ag: NEGATIVE

## 2022-06-10 NOTE — ED Triage Notes (Signed)
Patient c/o headache, sore throat, stuffy nose, body aches x 3 days.  Patient's husband did test positive for COVID on Monday night.  Patient denies any OTC meds.

## 2022-06-10 NOTE — ED Provider Notes (Addendum)
[name]    CSN: [id number] Arrival date & time: 06/10/22  0844      History   Chief Complaint Chief Complaint  Patient presents with   Exposure to COVID    HPI Kellie Murrill Win Lewanda Perea is a 61 y.o. female.   HPI  Patient's husband is at home and tested positive for COVID.  Patient has had symptoms for 3 days.  Headache, body aches, sore throat and stuffy nose for 3 days.  She has not taken any over-the-counter medicines.  She states she has had COVID vaccinations.  Is otherwise healthy with no ongoing prescription medications  Past Medical History:  Diagnosis Date   Acute kidney injury (HCC)    Hyperglycemia    Hyponatremia    Ovarian cyst     Patient Active Problem List   Diagnosis Date Noted   Closed fracture of right fifth proximal phalanx 05/21/2021   Hyponatremia 02/25/2016   Hypomagnesemia 02/25/2016   Acute kidney injury (HCC)    Hyperglycemia    Vertigo    Tremor     Past Surgical History:  Procedure Laterality Date   ABDOMINAL HYSTERECTOMY      OB History   No obstetric history on file.      Home Medications    Prior to Admission medications   Medication Sig Start Date End Date Taking? Authorizing Provider  Cholecalciferol (VITAMIN D-3 PO) Take by mouth.   Yes [provider]  latanoprost (XALATAN) 0.005 % ophthalmic solution 1 drop at bedtime. 05/26/22  Yes [provider]  calcium-vitamin D (OSCAL WITH D) 500-200 MG-UNIT TABS tablet Take 1 tablet by mouth daily.    [provider]    Family History Family History  Problem Relation Age of Onset   Breast cancer Mother    Hypertension Other    Diabetes Other    Cancer Other     Social History Social History   Tobacco Use   Smoking status: Never   Smokeless tobacco: Never  Substance Use Topics   Alcohol use: No   Drug use: No     Allergies   Septra [sulfamethoxazole-trimethoprim]   Review of Systems Review of Systems See  HPI  Physical Exam Triage Vital Signs ED Triage Vitals  Enc Vitals Group     BP 06/10/22 0859 (!) 147/88     Pulse Rate 06/10/22 0859 (!) 104     Resp 06/10/22 0859 18     Temp 06/10/22 0859 99.6 F (37.6 C)     Temp Source 06/10/22 0859 Oral     SpO2 06/10/22 0859 99 %     Weight 06/10/22 0901 106 lb (48.1 kg)     Height 06/10/22 0901 5\' 2"  (1.575 m)     Head Circumference --      Peak Flow --      Pain Score 06/10/22 0901 6     Pain Loc --      Pain Edu? --      Excl. in GC? --    No data found.  Updated Vital Signs BP (!) 147/88 (BP Location: Left Arm)   Pulse (!) 104   Temp 99.6 F (37.6 C) (Oral)   Resp 18   Ht 5\' 2"  (1.575 m)   Wt 48.1 kg   SpO2 99%   BMI 19.39 kg/m      Physical Exam Constitutional:      General: She is not in acute distress.    Appearance: She  is well-developed. She is ill-appearing.  HENT:     Head: Normocephalic and atraumatic.     Nose: No congestion.     Mouth/Throat:     Mouth: Mucous membranes are moist.     Pharynx: Posterior oropharyngeal erythema present.  Eyes:     Conjunctiva/sclera: Conjunctivae normal.     Pupils: Pupils are equal, round, and reactive to light.  Cardiovascular:     Rate and Rhythm: Normal rate and regular rhythm.     Heart sounds: Normal heart sounds.  Pulmonary:     Effort: Pulmonary effort is normal. No respiratory distress.     Breath sounds: Normal breath sounds.  Abdominal:     General: There is no distension.     Palpations: Abdomen is soft.  Musculoskeletal:        General: Normal range of motion.     Cervical back: Normal range of motion.  Skin:    General: Skin is warm and dry.  Neurological:     Mental Status: She is alert.      UC Treatments / Results  Labs (all labs ordered are listed, but only abnormal results are displayed) Labs Reviewed  SARS CORONAVIRUS 2 (TAT 6-24 HRS)  POC SARS CORONAVIRUS 2 AG -  ED    EKG   Radiology No results found.  Procedures Procedures  (including critical care time)  Medications Ordered in UC Medications - No data to display  Initial Impression / Assessment and Plan / UC Course  I have reviewed the triage vital signs and the nursing notes.  Pertinent labs & imaging results that were available during my care of the patient were reviewed by me and considered in my medical decision making (see chart for details).     With close exposure to COVID, and COVID symptoms 1 concerned regarding her negative rapid COVID test.  We will send a hospital PCR to confirm Final Clinical Impressions(s) / UC Diagnoses   Final diagnoses:  Close exposure to COVID-19 virus  Sore throat (viral)     Discharge Instructions      Check MyChart for your test results If your COVID test is positive, I will call in Paxlovid to your pharmacy Quarantine at home until your test results are available     ED Prescriptions   None    PDMP not reviewed this encounter.   Raylene Everts, MD 06/10/22 0240    Raylene Everts, MD 06/10/22 1020

## 2022-06-10 NOTE — Discharge Instructions (Signed)
Check MyChart for your test results If your COVID test is positive, I will call in Paxlovid to your pharmacy Quarantine at home until your test results are available

## 2022-06-11 LAB — SARS CORONAVIRUS 2 (TAT 6-24 HRS): SARS Coronavirus 2: NEGATIVE

## 2022-11-30 ENCOUNTER — Encounter: Payer: Self-pay | Admitting: Cardiology

## 2022-11-30 ENCOUNTER — Ambulatory Visit: Payer: Managed Care, Other (non HMO) | Admitting: Cardiology

## 2022-11-30 VITALS — BP 150/72 | HR 152 | Ht 62.0 in | Wt 104.0 lb

## 2022-11-30 DIAGNOSIS — R000 Tachycardia, unspecified: Secondary | ICD-10-CM

## 2022-11-30 DIAGNOSIS — R0609 Other forms of dyspnea: Secondary | ICD-10-CM

## 2022-11-30 NOTE — Progress Notes (Unsigned)
Primary Physician/Referring:  Gus Height, MD (Inactive)  Patient ID: [name], female    DOB: 1961-07-06, 62 y.o.   MRN: [id number]  Chief Complaint  Patient presents with   Tachycardia   New Patient (Initial Visit)   HPI:    [name]  is a 62 y.o. ***  Past Medical History:  Diagnosis Date   Acute kidney injury ([place])    Hyperglycemia    Hyponatremia    Ovarian cyst    Past Surgical History:  Procedure Laterality Date   ABDOMINAL HYSTERECTOMY     Family History  Problem Relation Age of Onset   Breast cancer Mother    Pneumonia Father    Hypertension Other    Diabetes Other    Cancer Other     Social History   Tobacco Use   Smoking status: Never   Smokeless tobacco: Never  Substance Use Topics   Alcohol use: No   Marital Status: Married  ROS  ***ROS Objective      11/30/2022    2:22 PM 06/10/2022    9:01 AM 06/10/2022    8:59 AM  Vitals with BMI  Height 5\' 2"  5\' 2"    Weight 104 lbs 106 lbs   BMI [id number] [id number]   Systolic Q000111Q  Q000111Q  Diastolic 72  88  Pulse 0000000  104   SpO2: 92 %   ***Physical Exam  Laboratory examination:   External labs:   Cholesterol, total 245.000 m 03/24/2022 HDL 91.000 mg 03/24/2022 LDL 147.000 m 03/24/2022 Triglycerides 52.000 mg 03/24/2022 A1C N/D Hemoglobin 13.700 g/d 03/24/2022 Creatinine, Serum 0.610 mg/ 03/24/2022 Potassium 4.300 mm 03/24/2022 Magnesium N/D ALT (SGPT) 38.000 U/L 03/24/2022  Radiology:    Cardiac Studies:   EKG:   EKG 11/30/2022: Sinus tachycardia at the rate of 154 bpm, normal axis, no evidence of ischemia.  LVH.   Medications and allergies   Allergies  Allergen Reactions   Septra [Sulfamethoxazole-Trimethoprim] Other (See Comments)    Dizziness, generalized weakness and tremor   Azithromycin Rash   Celecoxib     Other Reaction(s): has sulfar in it   Meloxicam     Other Reaction(s): has sulfar in it   Sulfa Antibiotics     Other Reaction(s): sick    Nitrofurantoin Rash     Medication list   Current Outpatient Medications:    Cholecalciferol (VITAMIN D-3 PO), Take 2,000 Units by mouth daily at 2 PM., Disp: , Rfl:    latanoprost (XALATAN) 0.005 % ophthalmic solution, 1 drop at bedtime., Disp: , Rfl:   Assessment     ICD-10-CM   1. Sinus tachycardia  R00.0 EKG 12-Lead    TSH+T4F+T3Free    PCV ECHOCARDIOGRAM COMPLETE    2. Dyspnea on exertion  R06.09 PCV ECHOCARDIOGRAM COMPLETE       Orders Placed This Encounter  Procedures   TSH+T4F+T3Free   EKG 12-Lead   PCV ECHOCARDIOGRAM COMPLETE    Standing Status:   Future    Standing Expiration Date:   11/30/2023    No orders of the defined types were placed in this encounter.   Medications Discontinued During This Encounter  Medication Reason   calcium-vitamin D (OSCAL WITH D) 500-200 MG-UNIT TABS tablet      Recommendations:   [name] is a 1 y.o. with no significant prior cardiovascular history, referred to me on a stat basis for evaluation of marked sinus tachycardia.    Adrian Prows, MD,  [facility] 11/30/2022, 3:42 PM Office: [phone]

## 2022-12-06 ENCOUNTER — Encounter: Payer: Self-pay | Admitting: Cardiology

## 2022-12-07 ENCOUNTER — Encounter: Payer: Self-pay | Admitting: Cardiology

## 2022-12-07 NOTE — Telephone Encounter (Signed)
From patient.

## 2022-12-07 NOTE — Progress Notes (Signed)
Labs 12/01/2022: TSH <0.005.  T4 elevated at 5.02.  T3 markedly elevated at 15.5.  Patient has been referred for endocrine consultation.

## 2022-12-25 ENCOUNTER — Other Ambulatory Visit: Payer: Managed Care, Other (non HMO)

## 2023-01-06 ENCOUNTER — Ambulatory Visit: Payer: Managed Care, Other (non HMO) | Admitting: Cardiology

## 2023-01-07 ENCOUNTER — Other Ambulatory Visit (HOSPITAL_COMMUNITY): Payer: Self-pay | Admitting: Internal Medicine

## 2023-01-07 DIAGNOSIS — E0590 Thyrotoxicosis, unspecified without thyrotoxic crisis or storm: Secondary | ICD-10-CM

## 2023-01-07 DIAGNOSIS — E0500 Thyrotoxicosis with diffuse goiter without thyrotoxic crisis or storm: Secondary | ICD-10-CM

## 2023-01-07 DIAGNOSIS — E049 Nontoxic goiter, unspecified: Secondary | ICD-10-CM

## 2023-01-20 ENCOUNTER — Encounter (HOSPITAL_COMMUNITY)
Admission: RE | Admit: 2023-01-20 | Discharge: 2023-01-20 | Disposition: A | Discharge status: Home / Self Care | Payer: Managed Care, Other (non HMO) | Source: Ambulatory Visit | Attending: Internal Medicine | Admitting: Internal Medicine

## 2023-01-20 DIAGNOSIS — E049 Nontoxic goiter, unspecified: Secondary | ICD-10-CM | POA: Insufficient documentation

## 2023-01-20 DIAGNOSIS — E0590 Thyrotoxicosis, unspecified without thyrotoxic crisis or storm: Secondary | ICD-10-CM | POA: Diagnosis present

## 2023-01-20 DIAGNOSIS — E0500 Thyrotoxicosis with diffuse goiter without thyrotoxic crisis or storm: Secondary | ICD-10-CM | POA: Diagnosis present

## 2023-01-20 MED ORDER — SODIUM IODIDE I-123 7.4 MBQ CAPS: 471.0000 | ORAL_CAPSULE | Freq: Once | ORAL | Status: DC

## 2023-01-21 ENCOUNTER — Encounter (HOSPITAL_COMMUNITY)
Admission: RE | Admit: 2023-01-21 | Discharge: 2023-01-21 | Disposition: A | Discharge status: Home / Self Care | Payer: Managed Care, Other (non HMO) | Source: Ambulatory Visit | Attending: Internal Medicine | Admitting: Internal Medicine

## 2023-01-25 ENCOUNTER — Other Ambulatory Visit (HOSPITAL_COMMUNITY): Payer: Self-pay | Admitting: Internal Medicine

## 2023-01-25 DIAGNOSIS — E0590 Thyrotoxicosis, unspecified without thyrotoxic crisis or storm: Secondary | ICD-10-CM

## 2023-01-27 ENCOUNTER — Other Ambulatory Visit (HOSPITAL_COMMUNITY): Payer: Self-pay | Admitting: Internal Medicine

## 2023-01-27 DIAGNOSIS — E0590 Thyrotoxicosis, unspecified without thyrotoxic crisis or storm: Secondary | ICD-10-CM

## 2023-01-27 NOTE — Written Directive (Addendum)
MOLECULAR IMAGING AND THERAPEUTICS WRITTEN DIRECTIVE   PATIENT NAME: [name]  PT DOB:   Jul 30, 1961                                              MRN: [id number]  ---------------------------------------------------------------------------------------------------------------------   I-131 WHOLE THYROID THERAPY (NON-CANCER)    RADIOPHARMACEUTICAL:   Iodine-131 Capsule    PRESCRIBED DOSE FOR ADMINISTRATION: 15 mCi   ROUTE OFADMINISTRATION: PO   DIAGNOSIS:  Hyperthyroidism   REFERRING PHYSICIAN: Talmage Coin    TSH:   TSH <0.005.  T4 elevated at 5.02.  T3 markedly elevated at 15.5.  as of 12/07/2022 Lab Results  Component Value Date   TSH 0.615 02/26/2016     PRIOR I-131 THERAPY (Date and Dose):   PRIOR RADIOLOGY EXAMS (Results and Date): NM THYROID MULT UPTAKE W/IMAGING  Result Date: 01/22/2023 CLINICAL DATA:  Hyperthyroidism.  Goiter. EXAM: THYROID SCAN AND UPTAKE - 4 AND 24 HOURS TECHNIQUE: Following oral administration of I-123 capsule, anterior planar imaging was acquired at 24 hours. Thyroid uptake was calculated with a thyroid probe at 4-6 hours and 24 hours. RADIOPHARMACEUTICALS:  One hundred seventy-one uCi I-123 sodium iodide p.o. COMPARISON:  None Available. FINDINGS: Homogeneous uptake in both thyroid lobes.  No nodules or masses. 4 hour I-123 uptake = 58.7% (normal 5-20%) 24 hour I-123 uptake = 47.1% (normal 10-30%) IMPRESSION: Elevated iodine uptake in the thyroid at 4 and 24 hours consistent with history of hyperthyroidism. No nodules or masses. Electronically Signed   By: Gerome Sam III M.D.   On: 01/22/2023 12:54      ADDITIONAL PHYSICIAN COMMENTS/NOTES Graves disease... Elevated uptake on scan. Depressed TSH   AUTHORIZED USER SIGNATURE & TIME STAMP: Patriciaann Clan, MD   02/02/23    11:26 AM

## 2023-02-04 ENCOUNTER — Encounter (HOSPITAL_COMMUNITY)
Admission: RE | Admit: 2023-02-04 | Discharge: 2023-02-04 | Disposition: A | Discharge status: Home / Self Care | Payer: Managed Care, Other (non HMO) | Source: Ambulatory Visit | Attending: Internal Medicine | Admitting: Internal Medicine

## 2023-02-04 DIAGNOSIS — E0590 Thyrotoxicosis, unspecified without thyrotoxic crisis or storm: Secondary | ICD-10-CM | POA: Diagnosis present

## 2023-02-04 MED ORDER — SODIUM IODIDE I 131 CAPSULE
13.8500 | Freq: Once | INTRAVENOUS | Status: AC
  Administered 2023-02-04: 13.85 via ORAL

## 2023-03-18 ENCOUNTER — Other Ambulatory Visit: Payer: Self-pay | Admitting: Family Medicine

## 2023-03-18 DIAGNOSIS — Z1231 Encounter for screening mammogram for malignant neoplasm of breast: Secondary | ICD-10-CM

## 2023-04-08 ENCOUNTER — Other Ambulatory Visit: Payer: Self-pay | Admitting: Family Medicine

## 2023-04-08 DIAGNOSIS — E2839 Other primary ovarian failure: Secondary | ICD-10-CM

## 2023-04-08 DIAGNOSIS — Z1231 Encounter for screening mammogram for malignant neoplasm of breast: Secondary | ICD-10-CM

## 2023-04-21 ENCOUNTER — Ambulatory Visit: Payer: Managed Care, Other (non HMO)

## 2023-04-21 DIAGNOSIS — E2839 Other primary ovarian failure: Secondary | ICD-10-CM

## 2023-04-21 DIAGNOSIS — Z1231 Encounter for screening mammogram for malignant neoplasm of breast: Secondary | ICD-10-CM | POA: Diagnosis not present

## 2024-03-23 ENCOUNTER — Other Ambulatory Visit: Payer: Self-pay | Admitting: Family Medicine

## 2024-03-23 DIAGNOSIS — Z1231 Encounter for screening mammogram for malignant neoplasm of breast: Secondary | ICD-10-CM

## 2024-04-03 ENCOUNTER — Ambulatory Visit: Admitting: Sports Medicine

## 2024-04-26 ENCOUNTER — Ambulatory Visit

## 2024-04-26 DIAGNOSIS — Z1231 Encounter for screening mammogram for malignant neoplasm of breast: Secondary | ICD-10-CM | POA: Diagnosis not present

## 2024-05-09 ENCOUNTER — Encounter: Payer: Self-pay | Admitting: Sports Medicine

## 2024-05-15 ENCOUNTER — Ambulatory Visit: Payer: Self-pay

## 2024-05-15 NOTE — Telephone Encounter (Addendum)
 Third attempt; left message Second attempt; left message First attempt; no answer   Copied from CRM [id number]. Topic: Clinical - Red Word Triage >> May 15, 2024  7:36 AM Cherylann RAMAN wrote: Red Word that prompted transfer to Nurse Triage: Patient called in with complaints of congestion, sore throat, cough, and a headache since last Friday. Wanted to be scheduled with Velma Ku. Patient is a new patient. Informed that he is not accepting new patients.
